# Patient Record
Sex: Female | Born: 1960 | Race: Black or African American | Hispanic: No | Marital: Married | Smoking: Never smoker
Health system: Southern US, Community
[De-identification: ages and names within clinical notes are randomized; demographics above are authoritative.]

## PROBLEM LIST (undated history)

## (undated) DIAGNOSIS — I10 Essential (primary) hypertension: Secondary | ICD-10-CM

## (undated) HISTORY — PX: MYOMECTOMY: SHX85

---

## 2007-12-01 ENCOUNTER — Emergency Department: Payer: Self-pay | Admitting: Emergency Medicine

## 2007-12-01 ENCOUNTER — Other Ambulatory Visit: Payer: Self-pay

## 2010-03-20 ENCOUNTER — Emergency Department: Payer: Self-pay | Admitting: Internal Medicine

## 2014-08-11 ENCOUNTER — Ambulatory Visit (INDEPENDENT_AMBULATORY_CARE_PROVIDER_SITE_OTHER): Payer: Managed Care, Other (non HMO)

## 2014-08-11 ENCOUNTER — Encounter: Payer: Self-pay | Admitting: Podiatry

## 2014-08-11 ENCOUNTER — Ambulatory Visit (INDEPENDENT_AMBULATORY_CARE_PROVIDER_SITE_OTHER): Payer: Managed Care, Other (non HMO) | Admitting: Podiatry

## 2014-08-11 VITALS — BP 115/80 | HR 70 | Resp 16 | Ht 67.0 in | Wt 160.0 lb

## 2014-08-11 DIAGNOSIS — M79672 Pain in left foot: Secondary | ICD-10-CM

## 2014-08-11 DIAGNOSIS — M21612 Bunion of left foot: Secondary | ICD-10-CM

## 2014-08-11 DIAGNOSIS — M2012 Hallux valgus (acquired), left foot: Secondary | ICD-10-CM

## 2014-08-11 NOTE — Progress Notes (Signed)
   Subjective:    Patient ID: Kelli Hernandez, female    DOB: 02/23/1961, 53 y.o.   MRN: [id number]  HPI Comments: Ms. Kelli Hernandez, 53 year old female, presents the office today with complaints of left foot bunion. She states that she has had a bunion over the area for several years however it has recently become more painful. She states the pain is mostly with walking and with shoes. She states that walking for periods of time will make her big toe ache. She has tried shoe gear changes, offloading, padding without any resolution of symptoms. Denies any recent injury or trauma to the area. No other complaints at this time.  Foot Pain      Review of Systems  Musculoskeletal:       Joint pain   All other systems reviewed and are negative.      Objective:   Physical Exam Objective: AAO x3, NAD DP/PT pulses palpable bilaterally, CRT less than 3 seconds Protective sensation intact with Simms Weinstein monofilament, vibratory sensation intact, Achilles tendon reflex intact Left foot structural HAV deformity with no pain or crepitus with range of motion of the first MTPJ. No hypermobility noted. Lateral deviation of the hallux with a prominent first metatarsal head medially. Mild hammertoe contractures of lesser digits, asymptomatic. MMT 5/5, ROM WNL No open lesions No calf pain with compression, swelling, warmth, erythema.       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 -X-rays were obtained and reviewed with the patient. -Conservative versus surgical options discussed including alternatives, risks, competitions. -At this time the patient has exhausted conservative treatment and she is requesting surgical intervention to help decrease her pain and deformity. I discussed with her various surgical options for correction at this time the patient elects to [facility]. I discussed with her risks of the surgery which include, but not  limited to, infection, bleeding, swelling, need for further surgery, delayed or nonhealing, painful or ugly scar, numbness or sensation changes, blood clots, hardware failure, reoccurrence, transfer lesions, over or under correction. I also discussed with her the postoperative course. No promises or guarantees were given as the outcome of the procedure. The surgical consent was reviewed with the patient. All questions were answered to the best of my ability. [facility]. -Follow-up after surgery. In the meantime the patient was encouraged to call the office with any questions, concerns or if there are any changes symptoms.

## 2014-08-11 NOTE — Patient Instructions (Signed)
Pre-Operative Instructions  Congratulations, you have decided to take an important step to improving your quality of life.  You can be assured that the doctors of [facility] will be with you every step of the way.  1. Plan to be at the surgery center/hospital at least 1 (one) hour prior to your scheduled time unless otherwise directed by the surgical center/hospital staff.  You must have a responsible adult accompany you, remain during the surgery and drive you home.  Make sure you have directions to the surgical center/hospital and know how to get there on time. 2. For hospital based surgery you will need to obtain a history and physical form from your family physician within 1 month prior to the date of surgery- we will give you a form for you primary physician.  3. We make every effort to accommodate the date you request for surgery.  There are however, times where surgery dates or times have to be moved.  We will contact you as soon as possible if a change in schedule is required.   4. No Aspirin/Ibuprofen for one week before surgery.  If you are on aspirin, any non-steroidal anti-inflammatory medications (Mobic, Aleve, Ibuprofen) you should stop taking it 7 days prior to your surgery.  You make take Tylenol  For pain prior to surgery.  5. Medications- If you are taking daily heart and blood pressure medications, seizure, reflux, allergy, asthma, anxiety, pain or diabetes medications, make sure the surgery center/hospital is aware before the day of surgery so they may notify you which medications to take or avoid the day of surgery. 6. No food or drink after midnight the night before surgery unless directed otherwise by surgical center/hospital staff. 7. No alcoholic beverages 24 hours prior to surgery.  No smoking 24 hours prior to or 24 hours after surgery. 8. Wear loose pants or shorts- loose enough to fit over bandages, boots, and casts. 9. No slip on shoes, sneakers are best. 10. Bring  your boot with you to the surgery center/hospital.  Also bring crutches or a walker if your physician has prescribed it for you.  If you do not have this equipment, it will be provided for you after surgery. 11. If you have not been contracted by the surgery center/hospital by the day before your surgery, call to confirm the date and time of your surgery. 12. Leave-time from work may vary depending on the type of surgery you have.  Appropriate arrangements should be made prior to surgery with your employer. 13. Prescriptions will be provided immediately following surgery by your doctor.  Have these filled as soon as possible after surgery and take the medication as directed. 14. Remove nail polish on the operative foot. 15. Wash the night before surgery.  The night before surgery wash the foot and leg well with the antibacterial soap provided and water paying special attention to beneath the toenails and in between the toes.  Rinse thoroughly with water and dry well with a towel.  Perform this wash unless told not to do so by your physician.  Enclosed: 1 Ice pack (please put in freezer the night before surgery)   1 Hibiclens skin cleaner   Pre-op Instructions  If you have any questions regarding the instructions, do not hesitate to call our office.  [place]: [address] [place], [place] [address] [phone]  [place]: [address] [place], [place] [address] [phone]  [place]: [address]  [place], [place] [address] [phone]  Dr. Richard   Tuchman DPM, Dr. Norman Regal DPM Dr. Richard Sikora DPM, Dr. M. Todd Hyatt DPM, Dr. Kathryn Egerton DPM 

## 2014-08-26 ENCOUNTER — Telehealth: Payer: Self-pay | Admitting: *Deleted

## 2014-08-26 NOTE — Telephone Encounter (Signed)
Pt called and was wanting a note for work because she is having surgery. i told pt she would more than likely get paperwork from her employer and will bring us the paperwork to fill out for her job on how long she will be out. Pt understood.

## 2014-09-10 DIAGNOSIS — M79673 Pain in unspecified foot: Secondary | ICD-10-CM

## 2014-09-14 ENCOUNTER — Encounter: Payer: Self-pay | Admitting: Podiatry

## 2014-09-14 DIAGNOSIS — M2012 Hallux valgus (acquired), left foot: Secondary | ICD-10-CM

## 2014-09-16 NOTE — Progress Notes (Signed)
[place] bunionectomy left with pin  Dos 12.7.15

## 2014-09-17 ENCOUNTER — Telehealth: Payer: Self-pay | Admitting: Podiatry

## 2014-09-17 NOTE — Telephone Encounter (Signed)
Attempted to call patient to follow-up with her after surgery, no answer.

## 2014-09-18 ENCOUNTER — Telehealth: Payer: Self-pay | Admitting: Podiatry

## 2014-09-18 NOTE — Telephone Encounter (Signed)
Called patient to follow-up with him after surgery. She states she is doing well and not requiring the narcotic pain medication and she is only taking ibuprofen. She has been continuing with the CAM boot. She denies any fevers, chills, nausea, vomiting. No other complaints. Follow-up as scheduled or sooner if any problems are to arise. Encouraged to call with any questions, concerns, change in symptoms.

## 2014-09-22 ENCOUNTER — Ambulatory Visit (INDEPENDENT_AMBULATORY_CARE_PROVIDER_SITE_OTHER): Payer: Managed Care, Other (non HMO)

## 2014-09-22 ENCOUNTER — Ambulatory Visit (INDEPENDENT_AMBULATORY_CARE_PROVIDER_SITE_OTHER): Payer: Managed Care, Other (non HMO) | Admitting: Podiatry

## 2014-09-22 VITALS — BP 119/76 | HR 66 | Resp 16

## 2014-09-22 DIAGNOSIS — Z9889 Other specified postprocedural states: Secondary | ICD-10-CM

## 2014-09-22 DIAGNOSIS — M21612 Bunion of left foot: Secondary | ICD-10-CM

## 2014-09-22 DIAGNOSIS — M2012 Hallux valgus (acquired), left foot: Secondary | ICD-10-CM

## 2014-09-22 NOTE — Progress Notes (Signed)
Patient ID: Kelli AstersBernice Hernandez, female   DOB: 06/21/1961, 53 y.o.   MRN: [id number]  Subjective: [age] year old female returns the office today postop visit #1 status post [place] bunionectomy left foot. Date of surgery 09/14/2014. The patient states that she is not taking any pain medication and only taking ibuprofen. She has completed her course of Keflex. She has not been taking the Phenergan as it is not needed. She's been continuing with the CAM boot. She does states that she had some increase in discomfort last night however it has decreased. She denies any systemic complaints such as fevers, chills, nausea, vomiting. No complaints such as shortness of breath, chest pain or calf pain. No other complaints at this time in no acute changes since last appointment.  Objective: AAO x3, NAD DP/PT pulses palpable bilaterally, CRT less than 3 seconds Protective sensation intact with Simms Weinstein monofilament, vibratory sensation intact, Achilles tendon reflex intact Incisional a dorsal medial aspect of the left foot as well coapted without any evidence of dehiscence. There is no surrounding erythema or ascending cellulitis. No drainage or other clinical signs of infection are noted. There is edema overlying the surgical site. There is mild discomfort upon palpation of the surgical site.  No pain with calf compression, swelling, warmth, erythema.  No open lesions or pre-ulcerative lesions.   Assessment: [age] year old female 8 days s/p Left Austin bunionectomy  Plan: -Treatment options were discussed including all alternatives, risks, complications -X-Ray were obtained and reviewed with the patient. The screw is slightly long which was discussed with the patient.  -Antibiotic ointment was applied over the incision followed by a DSD.  -Recommended to continue ice/elevation -Continue with CAM boot.  -Follow-up in 1 week or sooner should any problems arise. In the meantime, encouraged to call the office with any  questions, concerns, or change in symptoms.

## 2014-09-22 NOTE — Patient Instructions (Signed)
Keep bandages clean, dry, intact.  Monitor for any signs/symptoms of infection. Call the office immediately if any occur or go directly to the emergency room. Call with any questions/concerns.

## 2014-09-29 ENCOUNTER — Ambulatory Visit (INDEPENDENT_AMBULATORY_CARE_PROVIDER_SITE_OTHER): Payer: Managed Care, Other (non HMO) | Admitting: Podiatry

## 2014-09-29 ENCOUNTER — Encounter: Payer: Self-pay | Admitting: Podiatry

## 2014-09-29 VITALS — BP 128/76 | HR 65 | Resp 18

## 2014-09-29 DIAGNOSIS — Z9889 Other specified postprocedural states: Secondary | ICD-10-CM

## 2014-09-29 DIAGNOSIS — M21612 Bunion of left foot: Secondary | ICD-10-CM

## 2014-09-29 DIAGNOSIS — M2012 Hallux valgus (acquired), left foot: Secondary | ICD-10-CM

## 2014-10-05 NOTE — Progress Notes (Signed)
Patient ID: Ronney AstersBernice Tabb, female   DOB: 11/03/1960, 53 y.o.   MRN: [id number]  Subjective: 53 year old female presents the office today for postop visit #2 status post left foot bunionectomy date of surgery 09/14/2014. The patient states that she is doing well and her pain is controlled. She's been continuing the CAM boot. Denies any systemic complaints such as fevers, chills, nausea, vomiting. No chest pain, stress of breath or calf pain. No other complaints at this time in no acute changes since last appointment.  Objective: AAO 3, NAD DP/PT pulses palpable, CRT less than 3 seconds Protective sensation intact with Simms Weinstein monofilament Incision along the dorsal medial aspect of left foot well coapted without any evidence of dehiscence. There is no drainage expressed or any surrounding erythema or ascending cellulitis. There is edema overlying the surgical site although slightly decreased compared to last appointment. There is no clinical signs of infection. Mild discomfort dressing over the surgical site. There is no pain with range of motion of the first MTPJ. No other areas of pinpoint tenderness No pain with calf compression, swelling, warmth, erythema  Assessment: 53 year old female postop visit #2 status post left foot [place] bunionectomy date of surgery 09/14/14  Plan: -Treatment options discussed with the patient including alternatives, risks, complications. -Suture ends were cut, Steri-Strips were applied. -Discussed the patient she can start gradual gentle range of motion activities of the first MTPJ which were discussed with her. -Can start to shower however do not since the foot. Apply an appointment over the incision. Discussed the patient that there is any opening incision not to shower or get the area wet and call the office.  -Continue CAM boot. -Ice/elevation -Dispensed anklet to help edema.  -Follow-up in 2 weeks for repeat x-rays or sooner should any problems arise.  In the meantime, call the office with any questions, concerns, changes symptoms.

## 2014-10-13 ENCOUNTER — Ambulatory Visit (INDEPENDENT_AMBULATORY_CARE_PROVIDER_SITE_OTHER): Payer: Managed Care, Other (non HMO) | Admitting: Podiatry

## 2014-10-13 ENCOUNTER — Ambulatory Visit (INDEPENDENT_AMBULATORY_CARE_PROVIDER_SITE_OTHER): Payer: Managed Care, Other (non HMO)

## 2014-10-13 VITALS — BP 118/70 | HR 68 | Resp 16

## 2014-10-13 DIAGNOSIS — M21612 Bunion of left foot: Secondary | ICD-10-CM

## 2014-10-13 DIAGNOSIS — M2012 Hallux valgus (acquired), left foot: Secondary | ICD-10-CM

## 2014-10-13 DIAGNOSIS — Z9889 Other specified postprocedural states: Secondary | ICD-10-CM

## 2014-10-13 NOTE — Progress Notes (Signed)
Patient ID: Kelli AstersBernice Hernandez, female   DOB: 07/29/1961, 54 y.o.   MRN: [id number]  Subjective: 54 year old female returns the office for postop visit #3 status post left foot [name] bunionectomy, date of surgery 09/14/2014. She states that she's been continued with a CAM boot. She states that her pain is controlled currently and is doing well. She denies any systemic complaints such as fevers, chills, nausea, vomiting. Denies any chest pain, shortness of breath, calf pain. No acute changes since last appointment and no other complaints at this time.  Objective: AAO 3, NAD DP/PT pulses palpable, CRT less than 3 seconds Protective sensation intact with Simms Weinstein monofilament, Achilles tendon reflex intact. Incision along the dorsal medial aspect of the left foot as well coapted without any evidence of dehiscence. There is a small amount of flaky skin around the incision. There is no surrounding erythema, ascending cellulitis, or any other clinical signs of infection. First MTPJ range of motion is intact and without pain. There is mild edema overlying the surgical site. There is no significant tenderness to palpation over the surgical site.  No other areas of open lesions or pre-ulcerative lesions. No pain with calf compression, swelling, warmth, erythema.  Assessment: 54 year old female status post left foot [place] bunionectomy with screw fixation, date of surgery 09/14/2014  Plan: -X-rays were obtained and reviewed with the patient. The osteotomy site is still visible somewhat although is improved. -Treatment options were discussed with the patient including alternatives, risks, complications. -Discussed the patient to continue applying a small amount of antibiotic ointment over the incision. Discussed that she can wash the area however keep it clean. -Continue with CAM boot. -Continue with first MTPJ range of motion exercises. -Continue ice and elevation. -Recommended the patient is at a  work until follow-up evaluation. Follow-up in 2 weeks for repeat x-rays. At that time we'll likely start to transition out of the boot back into a regular loosefitting sneaker. After she is able to wear sneaker/shoe without problems she will be able to return to work. In the meantime, encouraged to call the office with any questions, concerns, change in symptoms.

## 2014-10-15 ENCOUNTER — Telehealth: Payer: Self-pay | Admitting: *Deleted

## 2014-10-15 NOTE — Telephone Encounter (Signed)
CALLED AND LEFT MESSAGE ASKING PT WHERE DISABILITY FORM NEEDS TO BE MAILED/FAXED TO?

## 2014-10-27 ENCOUNTER — Ambulatory Visit (INDEPENDENT_AMBULATORY_CARE_PROVIDER_SITE_OTHER): Payer: Managed Care, Other (non HMO) | Admitting: Podiatry

## 2014-10-27 ENCOUNTER — Ambulatory Visit (INDEPENDENT_AMBULATORY_CARE_PROVIDER_SITE_OTHER): Payer: Managed Care, Other (non HMO)

## 2014-10-27 VITALS — BP 133/80 | HR 76 | Resp 16

## 2014-10-27 DIAGNOSIS — M21612 Bunion of left foot: Secondary | ICD-10-CM

## 2014-10-27 DIAGNOSIS — Z9889 Other specified postprocedural states: Secondary | ICD-10-CM

## 2014-10-27 DIAGNOSIS — M2012 Hallux valgus (acquired), left foot: Secondary | ICD-10-CM | POA: Diagnosis not present

## 2014-10-27 NOTE — Progress Notes (Signed)
Patient ID: Kelli Hernandez, female   DOB: 04/08/1961, 54 y.o.   MRN: [id number]  Subjective: 54 year old female presents the office for follow-up evaluation status post left foot [place] Hernandez performed on 09/14/14. She states that she's been doing well she has not required any pain medication. Last week she did start to transition into a slip on shoe. She states that since started to wear the shoe she is not had any increase in discomfort. She doesn't other some mild swelling over the area however it has decreased. Denies any systemic complaints as fevers, chills, nausea, vomiting. No other complaints at this time in no acute changes since last appointment.  AAO x3, NAD DP/PT pulses palpable bilaterally, CRT less than 3 seconds Protective sensation intact with Simms Weinstein monofilament, vibratory sensation intact, Achilles tendon reflex intact Incision along the dorsal medial aspect of the left foot as well coapted without any evidence of dehiscence. There is no swelling erythema, drainage, or any other clinical signs of infection at this time. There is mild edema over the surgical site, although decreased. There is no tenderness along the incision or the surgical site. No pain with 1st MTPJ ROM, and the ROM is intact. No other areas of tenderness to bilateral lower extremities. No overlying erythema or increased warmth.  No pain with calf compression, swelling, warmth, erythema.  No open lesions or pre-ulcerative lesions.   Assessment: S/p Left Kelli Hernandez, doing well   Plan: -Treatment options discussed including all alternatives, risks, complications discussed with the patient.  -She continue transition into a regular shoe as tolerated. If there is any increasing discomfort while transitioning to return to the surgical shoe as needed and call the office. -She can return to work on Monday as tolerated with no restrictions other than wearing a supportive shoe. -Continues with anklet  to help swelling as needed. -Continue first MTPJ range of motion activities. -Follow-up in 4 weeks or sooner should any problems arise. In the meantime, Kelli Hernandez call the office with any questions, concerns, changes symptoms.

## 2014-11-24 ENCOUNTER — Ambulatory Visit: Payer: Managed Care, Other (non HMO) | Admitting: Podiatry

## 2015-03-11 ENCOUNTER — Ambulatory Visit: Payer: Managed Care, Other (non HMO) | Admitting: Podiatry

## 2015-07-27 ENCOUNTER — Ambulatory Visit: Payer: 59 | Admitting: Podiatry

## 2015-07-29 ENCOUNTER — Ambulatory Visit (INDEPENDENT_AMBULATORY_CARE_PROVIDER_SITE_OTHER): Payer: 59

## 2015-07-29 ENCOUNTER — Ambulatory Visit (INDEPENDENT_AMBULATORY_CARE_PROVIDER_SITE_OTHER): Payer: 59 | Admitting: Podiatry

## 2015-07-29 DIAGNOSIS — M2012 Hallux valgus (acquired), left foot: Secondary | ICD-10-CM | POA: Diagnosis not present

## 2015-07-29 DIAGNOSIS — L910 Hypertrophic scar: Secondary | ICD-10-CM

## 2015-07-29 DIAGNOSIS — M898X9 Other specified disorders of bone, unspecified site: Secondary | ICD-10-CM

## 2015-07-30 NOTE — Progress Notes (Signed)
Patient ID: Ronney AstersBernice Coca, female   DOB: 02/11/1961, 54 y.o.   MRN: [id number]  Subjective: 54 year old female presents to the office today for concerns of continued pain on the bunion site as well as a thick scar has formed overlying the area. She states that she is able to wear a regular shoe without much difficulty although she does get some occasional pain, not every day, overlying the bunion surgical site. She also states that since her last time with me she has about the thick scar over the surgical site. She said no previous treatment freed of these issues since I last saw her. No recent injury or trauma. No other complaints at this time.  Objective: AAO 3, NAD DP/PT pulses 2/4, CRT less than 3 seconds Protective sensation appears to be intact with Dorann OuSimms Weinstein monofilament Incision is well coapted and the dorsal medial aspect of the left foot however there does appear to be hypertrophic, keloid-like scar present. There is no tenderness to palpation upon the area directed to the scar. There is mild tenderness to palpation along the medial aspect of first metatarsal head as well as dorsally. Subjective she states it is not painful today although this is where she gets pain. Range of motion is intact the first MTPJ. There is no other areas of tenderness of bilateral lower shoes. There is no significant edema overlying the surgical site there is no assisted erythema or increase in warmth. There is no pain with calf compression, swelling, warmth, erythema.  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  -Treatment options discussed including all alternatives, risks, and complications -Etiology of symptoms were discussed -In regards to the pain on the top and the side of the foot I discussed x-ray findings. Compared to the prior x-rays there does appear to be some ever  trophic bone growth as well as an exostosis off the dorsal first metatarsal head. I discussed with her padding offloading. I also discussed the possible future surgery to smooth the area needed. Currently the pain is not consistent and she'll consider her options. -In regards to the hypertrophic/keloid scar area was injected today with triamcinolone and 0.5% Marcaine plain. Injection was completed without complications. Continue with vitamin E cream over on the area daily. -Follow-up in 2-3 weeks for repeat injection for the scar. In the meantime call the office with any questions, concerns, change in symptoms.  Ovid CurdMatthew Wagoner, DPM

## 2015-08-19 ENCOUNTER — Ambulatory Visit: Payer: 59 | Admitting: Podiatry

## 2015-09-07 ENCOUNTER — Ambulatory Visit (INDEPENDENT_AMBULATORY_CARE_PROVIDER_SITE_OTHER): Payer: 59 | Admitting: Podiatry

## 2015-09-07 ENCOUNTER — Encounter: Payer: Self-pay | Admitting: Podiatry

## 2015-09-07 VITALS — BP 117/78 | HR 77 | Resp 18

## 2015-09-07 DIAGNOSIS — L910 Hypertrophic scar: Secondary | ICD-10-CM | POA: Diagnosis not present

## 2015-09-07 DIAGNOSIS — M2012 Hallux valgus (acquired), left foot: Secondary | ICD-10-CM | POA: Diagnosis not present

## 2015-09-07 NOTE — Progress Notes (Signed)
Patient ID: Kelli Hernandez, female   DOB: 03/16/1961, 54 y.o.   MRN: [id number]  Subjective: 54 year old female presents to the office today for  Follow-up evaluation of keloid scarring over the bunion site and for pain status post bunion surgery. He states that since last appointment the injection helped quite a bit and she is not having any pain. She would proceed with treatment to help with the scarring.  No recent injury or trauma. No other complaints at this time.  Objective: AAO 3, NAD DP/PT pulses 2/4, CRT less than 3 seconds Protective sensation appears to be intact with Dorann OuSimms Weinstein monofilament Incision is well coapted and the dorsal medial aspect of the left foot however there does appear to be hypertrophic, keloid-like scar present. There is no tenderness to palpation upon the area directed to the scar. There is no tenderness to palpation along the medial aspect of first metatarsal head as well as dorsally where she was having pain previously. Range of motion is intact the first MTPJ. There is no other areas of tenderness of bilateral lower shoes. There is no significant edema overlying the surgical site there is no assisted erythema or increase in warmth. There is no pain with calf compression, swelling, warmth, erythema.  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 Injection was completed without complications. Continue with vitamin E cream over on the area daily. -Scar compound cream was ordered for the patient. -Offloading pads -Follow-up in 2-3 weeks for repeat injection for the scar. In the meantime call the office with any questions, concerns, change in symptoms.  Ovid CurdMatthew  Tequila Rottmann, DPM

## 2015-09-08 ENCOUNTER — Telehealth: Payer: Self-pay | Admitting: *Deleted

## 2015-09-08 MED ORDER — NONFORMULARY OR COMPOUNDED ITEM: Status: AC

## 2015-09-08 NOTE — Telephone Encounter (Signed)
Dr. Ardelle AntonWagoner ordered Aspirar compound BDV, orders faxed.

## 2015-09-28 ENCOUNTER — Ambulatory Visit: Admitting: Podiatry

## 2015-10-03 ENCOUNTER — Emergency Department
Admission: EM | Admit: 2015-10-03 | Discharge: 2015-10-03 | Disposition: A | Discharge status: Home / Self Care | Payer: 59 | Attending: Emergency Medicine | Admitting: Emergency Medicine

## 2015-10-03 ENCOUNTER — Encounter: Payer: Self-pay | Admitting: Emergency Medicine

## 2015-10-03 ENCOUNTER — Emergency Department: Payer: 59

## 2015-10-03 DIAGNOSIS — Z792 Long term (current) use of antibiotics: Secondary | ICD-10-CM | POA: Insufficient documentation

## 2015-10-03 DIAGNOSIS — Z79899 Other long term (current) drug therapy: Secondary | ICD-10-CM | POA: Diagnosis not present

## 2015-10-03 DIAGNOSIS — H8110 Benign paroxysmal vertigo, unspecified ear: Secondary | ICD-10-CM

## 2015-10-03 DIAGNOSIS — I10 Essential (primary) hypertension: Secondary | ICD-10-CM | POA: Diagnosis not present

## 2015-10-03 DIAGNOSIS — R42 Dizziness and giddiness: Secondary | ICD-10-CM | POA: Diagnosis present

## 2015-10-03 HISTORY — DX: Essential (primary) hypertension: I10

## 2015-10-03 LAB — CBC
HEMATOCRIT: 36.2 % (ref 35.0–47.0)
HEMOGLOBIN: 12.4 g/dL (ref 12.0–16.0)
MCH: 30.4 pg (ref 26.0–34.0)
MCHC: 34.1 g/dL (ref 32.0–36.0)
MCV: 89 fL (ref 80.0–100.0)
Platelets: 186 10*3/uL (ref 150–440)
RBC: 4.07 MIL/uL (ref 3.80–5.20)
RDW: 13.9 % (ref 11.5–14.5)
WBC: 5.8 10*3/uL (ref 3.6–11.0)

## 2015-10-03 LAB — COMPREHENSIVE METABOLIC PANEL
ALK PHOS: 66 U/L (ref 38–126)
ALT: 15 U/L (ref 14–54)
ANION GAP: 7 (ref 5–15)
AST: 24 U/L (ref 15–41)
Albumin: 3.7 g/dL (ref 3.5–5.0)
BILIRUBIN TOTAL: 0.6 mg/dL (ref 0.3–1.2)
BUN: 18 mg/dL (ref 6–20)
CALCIUM: 9.1 mg/dL (ref 8.9–10.3)
CO2: 27 mmol/L (ref 22–32)
Chloride: 104 mmol/L (ref 101–111)
Creatinine, Ser: 0.72 mg/dL (ref 0.44–1.00)
Glucose, Bld: 130 mg/dL — ABNORMAL HIGH (ref 65–99)
Potassium: 3.4 mmol/L — ABNORMAL LOW (ref 3.5–5.1)
Sodium: 138 mmol/L (ref 135–145)
TOTAL PROTEIN: 6.9 g/dL (ref 6.5–8.1)

## 2015-10-03 LAB — TROPONIN I: Troponin I: <0.03 ng/mL (ref <0.031)

## 2015-10-03 MED ORDER — MECLIZINE HCL 25 MG PO TABS: 25.0000 mg | ORAL_TABLET | Freq: Three times a day (TID) | ORAL | Status: AC | PRN

## 2015-10-03 MED ORDER — DIAZEPAM 5 MG/ML IJ SOLN
5.0000 mg | Freq: Once | INTRAMUSCULAR | Status: AC
  Administered 2015-10-03: 5 mg via INTRAVENOUS
  Filled 2015-10-03: qty 2

## 2015-10-03 MED ORDER — DIAZEPAM 5 MG PO TABS: 5.0000 mg | ORAL_TABLET | Freq: Three times a day (TID) | ORAL | Status: AC | PRN

## 2015-10-03 MED ORDER — SODIUM CHLORIDE 0.9 % IV SOLN
1000.0000 mL | Freq: Once | INTRAVENOUS | Status: AC
  Administered 2015-10-03: 1000 mL via INTRAVENOUS

## 2015-10-03 NOTE — ED Provider Notes (Signed)
[facility] Emergency Department Provider Note  ____________________________________________  Time seen: On arrival  I have reviewed the triage vital signs and the nursing notes.   HISTORY  Chief Complaint Dizziness    HPI Kelli Hernandez is a 54 y.o. female who presents with approximately one week of intermittent vertigo. She reports movement of her head in a certain direction will trigger the sensation of the room spinning, nausea and feeling off balance. She has had this in the distant past before. She denies ear pain although she is just getting over a cold. No head injury. No focal neuro deficits. No headache. Negative medications. No ringing in her ear. He has had improvement with Antivert     Past Medical History  Diagnosis Date  . Hypertension     There are no active problems to display for this patient.   Past Surgical History  Procedure Laterality Date  . Myomectomy      Current Outpatient Rx  Name  Route  Sig  Dispense  Refill  . EXPIRED: amLODipine (NORVASC) 5 MG tablet   Oral   Take 5 mg by mouth.         Marland Kitchen amLODipine (NORVASC) 5 MG tablet      TAKE 1 TABLET (5 MG TOTAL) BY MOUTH DAILY.         Marland Kitchen Black Cohosh 40 MG CAPS   Oral   Take by mouth 2 (two) times daily.         . cephALEXin (KEFLEX) 500 MG capsule               . citalopram (CELEXA) 20 MG tablet   Oral   Take by mouth.         . hydrochlorothiazide (HYDRODIURIL) 25 MG tablet   Oral   Take by mouth.         . hydrochlorothiazide (HYDRODIURIL) 25 MG tablet      TAKE 1 TABLET (25 MG TOTAL) BY MOUTH DAILY.         Marland Kitchen ibuprofen (ADVIL,MOTRIN) 200 MG tablet   Oral   Take 200 mg by mouth as needed.         . NONFORMULARY OR COMPOUNDED ITEM      [facility] compound:  (BDV) Tendinosis - Strictures - Scarring cream containing Baclofen 2%, Diclofenac 3%, Verapamil 10% dispense 180 grams +2 refills to apply to affected area 1-2 grams 3-4 times  a day.   180 each   2   . Nutritional Supplements (ESTROVEN NIGHTTIME PO)   Oral   Take by mouth daily.           Allergies Morphine and Sulfa antibiotics  No family history on file.  Social History Social History  Substance Use Topics  . Smoking status: Never Smoker   . Smokeless tobacco: None  . Alcohol Use: No    Review of Systems  Constitutional: Negative for fever. Eyes: Negative for visual changes. ENT: Negative for sore throat Cardiovascular: Negative for chest pain. Respiratory: Negative for shortness of breath. Gastrointestinal: Negative for abdominal pain, Genitourinary: Negative for dysuria. Musculoskeletal: Negative for back pain. Skin: Negative for rash. Neurological: Negative for headaches or focal weakness Psychiatric: No anxiety    ____________________________________________   PHYSICAL EXAM:  VITAL SIGNS: ED Triage Vitals  Enc Vitals Group     BP 10/03/15 0941 122/79 mmHg     Pulse Rate 10/03/15 0941 68     Resp 10/03/15 0941 18     Temp  10/03/15 0941 98.1 F (36.7 C)     Temp Source 10/03/15 0941 Oral     SpO2 10/03/15 0941 96 %     Weight 10/03/15 0941 160 lb (72.576 kg)     Height 10/03/15 0941 5' 7.5" (1.715 m)     Head Cir --      Peak Flow --      Pain Score 10/03/15 0942 2     Pain Loc --      Pain Edu? --      Excl. in GC? --      Constitutional: Alert and oriented. Well appearing and in no distress. Eyes: Conjunctivae are normal.  ENT   Head: Normocephalic and atraumatic.   Mouth/Throat: Mucous membranes are moist. Ears: Fluid noted behind bilateral TMs, no erythema Cardiovascular: Normal rate, regular rhythm. Normal and symmetric distal pulses are present in all extremities. No murmurs, rubs, or gallops. Respiratory: Normal respiratory effort without tachypnea nor retractions. Breath sounds are clear and equal bilaterally.  Gastrointestinal: Soft and non-tender in all quadrants. No distention. There is no CVA  tenderness. Genitourinary: deferred Musculoskeletal: Nontender with normal range of motion in all extremities. No lower extremity tenderness nor edema. Neurologic:  Normal speech and language. No gross focal neurologic deficits are appreciated. Cranial nerves II through XII are normal, strength normal in all extremities Skin:  Skin is warm, dry and intact. No rash noted. Psychiatric: Mood and affect are normal. Patient exhibits appropriate insight and judgment.  ____________________________________________    LABS (pertinent positives/negatives)  Labs Reviewed  COMPREHENSIVE METABOLIC PANEL - Abnormal; Notable for the following:    Potassium 3.4 (*)    Glucose, Bld 130 (*)    All other components within normal limits  CBC  TROPONIN I    ____________________________________________   EKG  ED ECG REPORT I, Jene EveryKINNER, Kamalei Roeder, the attending physician, personally viewed and interpreted this ECG.  Date: 10/03/2015 EKG Time: 10:07 AM Rate: 64 Rhythm: normal sinus rhythm QRS Axis: normal Intervals: normal ST/T Wave abnormalities: normal Conduction Disutrbances: none Narrative Interpretation: unremarkable   ____________________________________________    RADIOLOGY I have personally reviewed any xrays that were ordered on this patient: CT head unremarkable  ____________________________________________   PROCEDURES  Procedure(s) performed: none  Critical Care performed: none  ____________________________________________   INITIAL IMPRESSION / ASSESSMENT AND PLAN / ED COURSE  Pertinent labs & imaging results that were available during my care of the patient were reviewed by me and considered in my medical decision making (see chart for details).  Patient well-appearing and in no distress. History of present illness is most consistent with BPV. CT head is unremarkable. Exam including detailed neuro exam is unremarkable. Patient had improvement with Valium and IV  fluids. We will discharge with ENT follow-up with Valium and meclizine. Strict return precautions discussed  ____________________________________________   FINAL CLINICAL IMPRESSION(S) / ED DIAGNOSES  Final diagnoses:  BPV (benign positional vertigo), unspecified laterality     Jene Everyobert Jemell Town, MD 10/03/15 1413

## 2015-10-03 NOTE — ED Notes (Addendum)
Patient to ER for "vertigo" x 1 week. +Nausea and headache. Patient has h/o vertigo episode x2 approx 5 years ago, but states this episode has been intermittent/lasted longer than last episodes.

## 2015-10-03 NOTE — Discharge Instructions (Signed)
Benign Positional Vertigo Vertigo is the feeling that you or your surroundings are moving when they are not. Benign positional vertigo is the most common form of vertigo. The cause of this condition is not serious (is benign). This condition is triggered by certain movements and positions (is positional). This condition can be dangerous if it occurs while you are doing something that could endanger you or others, such as driving.  CAUSES In many cases, the cause of this condition is not known. It may be caused by a disturbance in an area of the inner ear that helps your brain to sense movement and balance. This disturbance can be caused by a viral infection (labyrinthitis), head injury, or repetitive motion. RISK FACTORS This condition is more likely to develop in:  Women.  People who are 50 years of age or older. SYMPTOMS Symptoms of this condition usually happen when you move your head or your eyes in different directions. Symptoms may start suddenly, and they usually last for less than a minute. Symptoms may include:  Loss of balance and falling.  Feeling like you are spinning or moving.  Feeling like your surroundings are spinning or moving.  Nausea and vomiting.  Blurred vision.  Dizziness.  Involuntary eye movement (nystagmus). Symptoms can be mild and cause only slight annoyance, or they can be severe and interfere with daily life. Episodes of benign positional vertigo may return (recur) over time, and they may be triggered by certain movements. Symptoms may improve over time. DIAGNOSIS This condition is usually diagnosed by medical history and a physical exam of the head, neck, and ears. You may be referred to a health care provider who specializes in ear, nose, and throat (ENT) problems (otolaryngologist) or a provider who specializes in disorders of the nervous system (neurologist). You may have additional testing, including:  MRI.  A CT scan.  Eye movement tests. Your  health care provider may ask you to change positions quickly while he or she watches you for symptoms of benign positional vertigo, such as nystagmus. Eye movement may be tested with an electronystagmogram (ENG), caloric stimulation, the Dix-Hallpike test, or the roll test.  An electroencephalogram (EEG). This records electrical activity in your brain.  Hearing tests. TREATMENT Usually, your health care provider will treat this by moving your head in specific positions to adjust your inner ear back to normal. Surgery may be needed in severe cases, but this is rare. In some cases, benign positional vertigo may resolve on its own in 2-4 weeks. HOME CARE INSTRUCTIONS Safety  Move slowly.Avoid sudden body or head movements.  Avoid driving.  Avoid operating heavy machinery.  Avoid doing any tasks that would be dangerous to you or others if a vertigo episode would occur.  If you have trouble walking or keeping your balance, try using a cane for stability. If you feel dizzy or unstable, sit down right away.  Return to your normal activities as told by your health care provider. Ask your health care provider what activities are safe for you. General Instructions  Take over-the-counter and prescription medicines only as told by your health care provider.  Avoid certain positions or movements as told by your health care provider.  Drink enough fluid to keep your urine clear or pale yellow.  Keep all follow-up visits as told by your health care provider. This is important. SEEK MEDICAL CARE IF:  You have a fever.  Your condition gets worse or you develop new symptoms.  Your family or friends   notice any behavioral changes.  Your nausea or vomiting gets worse.  You have numbness or a "pins and needles" sensation. SEEK IMMEDIATE MEDICAL CARE IF:  You have difficulty speaking or moving.  You are always dizzy.  You faint.  You develop severe headaches.  You have weakness in your  legs or arms.  You have changes in your hearing or vision.  You develop a stiff neck.  You develop sensitivity to light.   This information is not intended to replace advice given to you by your health care provider. Make sure you discuss any questions you have with your health care provider.   Document Released: 07/03/2006 Document Revised: 06/16/2015 Document Reviewed: 01/18/2015 Elsevier Interactive Patient Education 2016 [employer]  

## 2015-10-03 NOTE — ED Notes (Addendum)
Pt reports having a history of vertigo off and on for several years.  For the past 8-9 days patient has been having vertigo off and on and this morning she woke up and started having vertigo and decided to come to be evaluated. Pt has hx of HTN and did not take her BP medications yet this morning. C/o vertigo with movement of her head in certain directions.

## 2015-10-12 ENCOUNTER — Ambulatory Visit (INDEPENDENT_AMBULATORY_CARE_PROVIDER_SITE_OTHER): Admitting: Podiatry

## 2015-10-12 ENCOUNTER — Encounter: Payer: Self-pay | Admitting: Podiatry

## 2015-10-12 VITALS — BP 127/80 | HR 61 | Resp 18

## 2015-10-12 DIAGNOSIS — L910 Hypertrophic scar: Secondary | ICD-10-CM | POA: Diagnosis not present

## 2015-10-12 NOTE — Progress Notes (Signed)
Patient ID: Kelli Hernandez, female   DOB: 05/22/1961, 55 y.o.   MRN: [id number]  Subjective: 55 year old female presents to the office today for follow-up evaluation of keloid scarring over the bunion site and for pain status post bunion surgery. She states that since starting the steroid injection she has noticed that the incision/scar has flattened although it does remain discolored. She was a compound cream due to cost. No other complaints at this time.  Objective: AAO 3, NAD DP/PT pulses 2/4, CRT less than 3 seconds Protective sensation appears to be intact with Dorann OuSimms Weinstein monofilament Incision is well coapted and the dorsal medial aspect of the left foot however there does appear to be hypertrophic, keloid-like scar present. The scar does appear to be more flat compared to previous appointment although it does remain hyperpigmented. There is no pain to the scar. No pain along the surgical site or with MPJ range of motion. No other areas of tenderness bilateral lower extremities. There is no significant edema overlying the surgical site there is no assisted erythema or increase in warmth. There is no pain with calf compression, swelling, warmth, erythema.  Assessment: 55 year old female with hypertrophic/keloid scar with improving symptoms.   Plan: -Treatment options discussed including all alternatives, risks, and complications -In regards to the hypertrophic/keloid scar area was injected today with triamcinolone and 0.5% andMarcaine plain. Injection was completed without complications. Continue with vitamin E cream over on the area daily. -Scar compound cream was ordered for the patient. Re-ordered through a different pharmacy.  -Follow-up in 3 weeks for repeat injection for the scar. In the meantime call the office with any questions, concerns, change in symptoms.  Ovid CurdMatthew Wagoner, DPM

## 2015-11-05 ENCOUNTER — Other Ambulatory Visit: Payer: Self-pay | Admitting: Otolaryngology

## 2015-11-05 DIAGNOSIS — R42 Dizziness and giddiness: Secondary | ICD-10-CM

## 2015-11-10 ENCOUNTER — Ambulatory Visit
Admission: RE | Admit: 2015-11-10 | Discharge: 2015-11-10 | Disposition: A | Discharge status: Home / Self Care | Payer: 59 | Source: Ambulatory Visit | Attending: Otolaryngology | Admitting: Otolaryngology

## 2015-11-10 DIAGNOSIS — R42 Dizziness and giddiness: Secondary | ICD-10-CM

## 2015-11-10 MED ORDER — GADOBENATE DIMEGLUMINE 529 MG/ML IV SOLN
15.0000 mL | Freq: Once | INTRAVENOUS | Status: AC | PRN
  Administered 2015-11-10: 15 mL via INTRAVENOUS

## 2015-11-11 ENCOUNTER — Ambulatory Visit: Admitting: Podiatry

## 2015-12-09 ENCOUNTER — Ambulatory Visit: Admitting: Podiatry

## 2017-05-17 ENCOUNTER — Emergency Department: Payer: 59

## 2017-05-17 ENCOUNTER — Emergency Department
Admission: EM | Admit: 2017-05-17 | Discharge: 2017-05-17 | Disposition: A | Discharge status: Home / Self Care | Payer: 59 | Attending: Emergency Medicine | Admitting: Emergency Medicine

## 2017-05-17 ENCOUNTER — Encounter: Payer: Self-pay | Admitting: Emergency Medicine

## 2017-05-17 DIAGNOSIS — Z7982 Long term (current) use of aspirin: Secondary | ICD-10-CM | POA: Insufficient documentation

## 2017-05-17 DIAGNOSIS — Z79899 Other long term (current) drug therapy: Secondary | ICD-10-CM | POA: Diagnosis not present

## 2017-05-17 DIAGNOSIS — R079 Chest pain, unspecified: Secondary | ICD-10-CM

## 2017-05-17 DIAGNOSIS — I10 Essential (primary) hypertension: Secondary | ICD-10-CM | POA: Diagnosis not present

## 2017-05-17 LAB — TROPONIN I

## 2017-05-17 LAB — BASIC METABOLIC PANEL
Anion gap: 5 (ref 5–15)
BUN: 14 mg/dL (ref 6–20)
CALCIUM: 9.4 mg/dL (ref 8.9–10.3)
CO2: 30 mmol/L (ref 22–32)
CREATININE: 0.98 mg/dL (ref 0.44–1.00)
Chloride: 102 mmol/L (ref 101–111)
Glucose, Bld: 81 mg/dL (ref 65–99)
Potassium: 3.7 mmol/L (ref 3.5–5.1)
SODIUM: 137 mmol/L (ref 135–145)

## 2017-05-17 LAB — CBC
HCT: 36.8 % (ref 35.0–47.0)
Hemoglobin: 12.6 g/dL (ref 12.0–16.0)
MCH: 30.4 pg (ref 26.0–34.0)
MCHC: 34.3 g/dL (ref 32.0–36.0)
MCV: 88.7 fL (ref 80.0–100.0)
PLATELETS: 210 10*3/uL (ref 150–440)
RBC: 4.15 MIL/uL (ref 3.80–5.20)
RDW: 14 % (ref 11.5–14.5)
WBC: 7 10*3/uL (ref 3.6–11.0)

## 2017-05-17 MED ORDER — ASPIRIN 81 MG PO CHEW
243.0000 mg | CHEWABLE_TABLET | Freq: Once | ORAL | Status: AC
Start: 2017-05-17 — End: 2017-05-17
  Administered 2017-05-17: 243 mg via ORAL
  Filled 2017-05-17: qty 3

## 2017-05-17 MED ORDER — ASPIRIN EC 81 MG PO TBEC: 243.0000 mg | DELAYED_RELEASE_TABLET | Freq: Once | ORAL | Status: DC

## 2017-05-17 NOTE — ED Triage Notes (Signed)
Pt with left sided chest pain radiating into left arm since yesterday

## 2017-05-17 NOTE — Discharge Instructions (Signed)

## 2017-05-17 NOTE — ED Notes (Signed)
Medication administered per MD order. Pt visualized in NAD, explained waiting on results on 2nd troponin. Pt and husband state understanding. Will continue to monitor for further patient needs.

## 2017-05-17 NOTE — ED Provider Notes (Signed)
[facility] Emergency Department Provider Note  ____________________________________________   First MD Initiated Contact with Patient 05/17/17 1642     (approximate)  I have reviewed the triage vital signs and the nursing notes.   HISTORY  Chief Complaint Chest Pain    HPI Kelli Hernandez is a 56 y.o. female who presents for evaluation of chest pain that occurred both last night and again today. She reports that she has had several episodes of chest pain over the last few months and it seems to only occur when she is under particularly severe stress either at work or at home. She has never had it evaluated for the reason that she associated it with stress, but when it happened today it was stronger than before. She describes it as being a left-sided and radiated somewhat down her arm. She is asymptomatic at this time. It was not accompanied with shortness of breath, nausea, vomiting, sweating, or abdominal pain. Nothing in particular makes it better and it did last for several hours both yesterday and today. even though she feels confident that it is associated only with stress she felt like she should have it evaluated "just to make sure everything was okay".  she has a primary care doctor but has not ever seen a cardiologist. She has hypertension. She does not have diabetes, hyperlipidemia, has never used tobacco, and has no first-degree relatives that I have ever suffered from ACS/MI. she has had no numbness nor tingling nor weakness in her arms nor legs although the pain did radiate somewhat to her left arm.   Past Medical History:  Diagnosis Date  . Hypertension     There are no active problems to display for this patient.   Past Surgical History:  Procedure Laterality Date  . MYOMECTOMY      Prior to Admission medications   Medication Sig Start Date End Date Taking? Authorizing Provider  Acetaminophen 325 MG CAPS Take 3 capsules by mouth daily.    Yes [provider]  amLODipine (NORVASC) 5 MG tablet Take 5 mg by mouth. 04/02/14 05/17/17 Yes [provider]  aspirin EC 81 MG tablet Take 81 mg by mouth daily.   Yes [provider]  citalopram (CELEXA) 20 MG tablet Take by mouth.   Yes [provider]  hydrochlorothiazide (HYDRODIURIL) 25 MG tablet TAKE 1 TABLET (25 MG TOTAL) BY MOUTH DAILY. 09/28/14  Yes [provider]  meclizine (ANTIVERT) 25 MG tablet Take 1 tablet (25 mg total) by mouth 3 (three) times daily as needed. 10/03/15  Yes Jene EveryKinner, Robert, MD  ibuprofen (ADVIL,MOTRIN) 200 MG tablet Take 200 mg by mouth as needed.    [provider]  NONFORMULARY OR COMPOUNDED ITEM [facility] compound:  (BDV) Tendinosis - Strictures - Scarring cream containing Baclofen 2%, Diclofenac 3%, Verapamil 10% dispense 180 grams +2 refills to apply to affected area 1-2 grams 3-4 times a day. Patient not taking: Reported on 05/17/2017 09/08/15   Vivi BarrackWagoner, Matthew R, DPM  Nutritional Supplements (ESTROVEN NIGHTTIME PO) Take by mouth daily.    [provider]    Allergies Morphine and Sulfa antibiotics  No family history on file.  Social History Social History  Substance Use Topics  . Smoking status: Never Smoker  . Smokeless tobacco: Never Used  . Alcohol use No    Review of Systems Constitutional: No fever/chills Eyes: No visual changes. ENT: No sore throat. Cardiovascular: +chest pain, episodic, first occurred at the beginning of the year,  then occurred again last night and today Respiratory: Denies shortness of breath. Gastrointestinal: No abdominal pain.  No nausea, no vomiting.  No diarrhea.  No constipation. Genitourinary: Negative for dysuria. Musculoskeletal: Negative for neck pain.  Negative for back pain. Integumentary: Negative for rash. Neurological: Negative for headaches, focal weakness or numbness.   ____________________________________________   PHYSICAL  EXAM:  ED Triage Vitals  Enc Vitals Group     BP 05/17/17 1630 134/83     Pulse Rate 05/17/17 1700 (!) 59     Resp 05/17/17 1730 13     Temp --      Temp src --      SpO2 05/17/17 1700 100 %     Weight 05/17/17 1427 72.6 kg (160 lb)     Height --      Head Circumference --      Peak Flow --      Pain Score 05/17/17 1426 8     Pain Loc --      Pain Edu? --      Excl. in GC? --     Constitutional: Alert and oriented. Well appearing and in no acute distress. Eyes: Conjunctivae are normal.  Head: Atraumatic. Nose: No congestion/rhinnorhea. Mouth/Throat: Mucous membranes are moist. Neck: No stridor.  No meningeal signs.   Cardiovascular: Normal rate, regular rhythm. Good peripheral circulation. Grossly normal heart sounds. Respiratory: Normal respiratory effort.  No retractions. Lungs CTAB. Gastrointestinal: Soft and nontender. No distention.  Musculoskeletal: No lower extremity tenderness nor edema. No gross deformities of extremities. Neurologic:  Normal speech and language. No gross focal neurologic deficits are appreciated.  Skin:  Skin is warm, dry and intact. No rash noted. Psychiatric: Mood and affect are normal. Speech and behavior are normal.  ____________________________________________   LABS (all labs ordered are listed, but only abnormal results are displayed)  Labs Reviewed  BASIC METABOLIC PANEL  CBC  TROPONIN I  TROPONIN I   ____________________________________________  EKG  ED ECG REPORT I, Zahniya Zellars, the attending physician, personally viewed and interpreted this ECG.  Date: 05/17/2017 EKG Time: 14:21 Rate: 62 Rhythm: normal sinus rhythm QRS Axis: normal Intervals: normal ST/T Wave abnormalities: normal Narrative Interpretation: unremarkable  ____________________________________________  RADIOLOGY   Dg Chest 2 View  Result Date: 05/17/2017 CLINICAL DATA:  Central chest pain for 2 days.  Hypertension. EXAM: CHEST  2 VIEW COMPARISON:   None. FINDINGS: The heart size and mediastinal contours are within normal limits. Both lungs are clear. The visualized skeletal structures are unremarkable. IMPRESSION: Negative.  No active cardiopulmonary disease. Electronically Signed   By: Myles Rosenthal M.D.   On: 05/17/2017 14:56    ____________________________________________   PROCEDURES  Critical Care performed: No   Procedure(s) performed:   Procedures   ____________________________________________   INITIAL IMPRESSION / ASSESSMENT AND PLAN / ED COURSE  Pertinent labs & imaging results that were available during my care of the patient were reviewed by me and considered in my medical decision making (see chart for details).  the patient may very well be suffering from angina but she is asymptomatic now and has a normal EKG in a normal set of lab work.  Her HEART score is 3-4 based on the subjective question of whether her history is "moderately" or "highly" suspicious.  She has a Wells score for PE of zero.  I had my usual ACS spectrum discussion with the patient and her husband, and she is obviously involved in her own medical care, intelligent, and reliable  for follow-up. I gave her the option of coming into the hospital or following up as an outpatient particularly if her second troponin is negative and she far prefers to go as an outpatient although she did agree to do whatever I suggested was best. However because she is a reliable historian and I believe she will be appropriate for close follow-up and because she can generally be seen within the next several days, I think close  cardiology follow-up is appropriate. I gave the patient three baby aspirin (she Artie takes one daily baby aspirin) and we will check a second troponin. As long as she stays comfortable I will provide her with information for cardiology follow-up. She and her husband understand and agree with the plan.  Clinical Course as of May 17 2336  Thu May 17, 2017  1900 second troponin is negative and patient is asymptomatic.  She is comfortable with the plan for outpatient follow-up and in fact does not want to stay in the hospital; as documented above I did offer her admission initially).  the patient notes she needs to follow up at the next available opportunity with the cardiologist(s) provided in the discharge papers.  I gave my usual and customary return precautions.     [CF]    Clinical Course User Index [CF] Loleta Rose, MD    ____________________________________________  FINAL CLINICAL IMPRESSION(S) / ED DIAGNOSES  Final diagnoses:  Chest pain, unspecified type     MEDICATIONS GIVEN DURING THIS VISIT:  Medications  aspirin chewable tablet 243 mg (243 mg Oral Given 05/17/17 1814)     NEW OUTPATIENT MEDICATIONS STARTED DURING THIS VISIT:  Discharge Medication List as of 05/17/2017  7:03 PM      Discharge Medication List as of 05/17/2017  7:03 PM      Discharge Medication List as of 05/17/2017  7:03 PM       Note:  This document was prepared using Dragon voice recognition software and may include unintentional dictation errors.    Loleta Rose, MD 05/17/17 2337

## 2017-08-26 IMAGING — CT CT HEAD W/O CM
1 series · 16 of 30 positions shown, 20 images · non-contrast
Comparison: None.

CLINICAL DATA: Vertigo for several days

EXAM:
CT HEAD WITHOUT CONTRAST
TECHNIQUE: Contiguous axial images were obtained from the base of the skull
through the vertex without intravenous contrast.

[Series 2: head wo · axial · 0.40mm/px · z∈[-164,-38]mm · 16 of 32 slices shown, 20 images]
[im 2/32  brain]
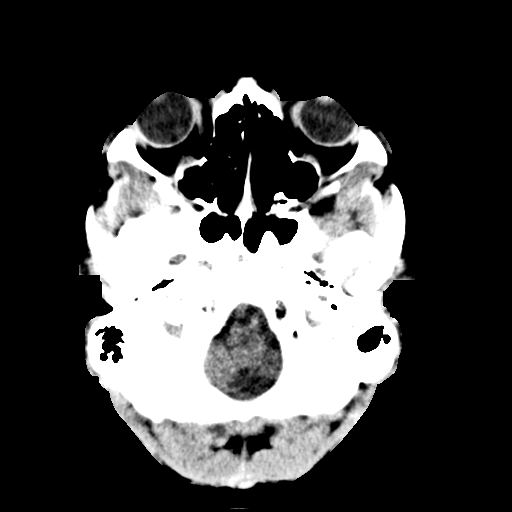
[im 2/32  bone]
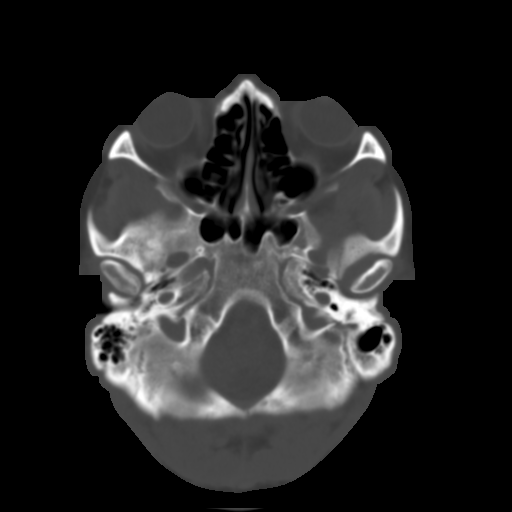
[im 4/32  brain]
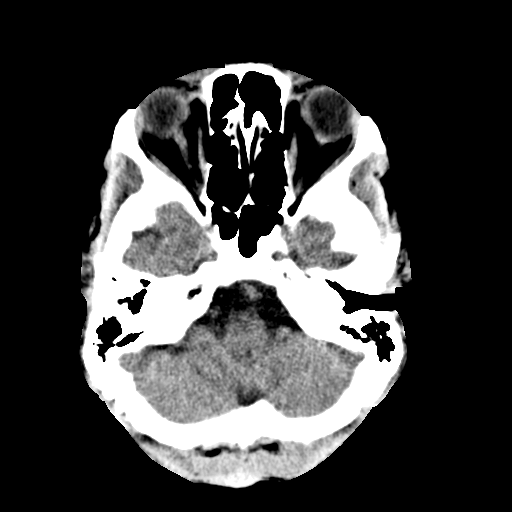
[im 6/32  brain]
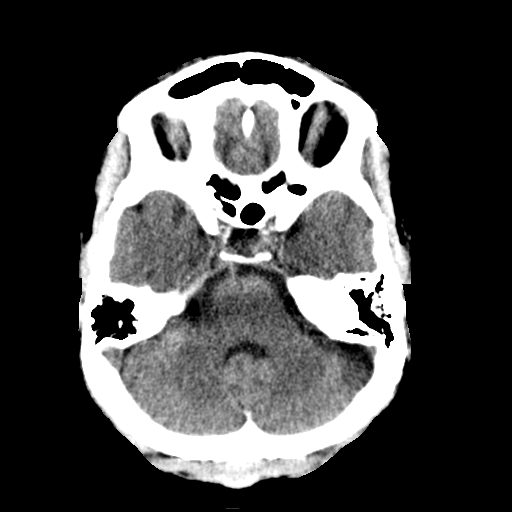
[im 8/32  brain]
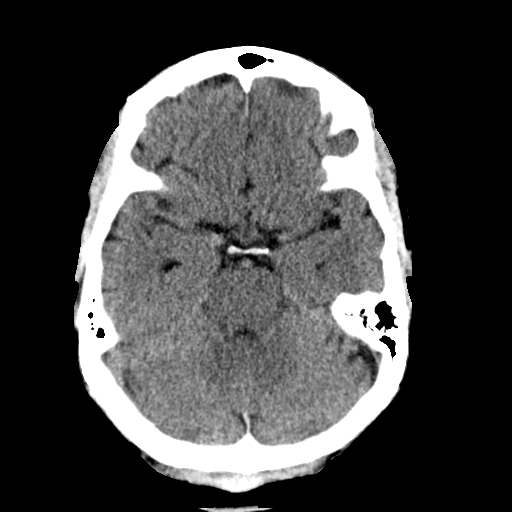
[im 9/32  brain]
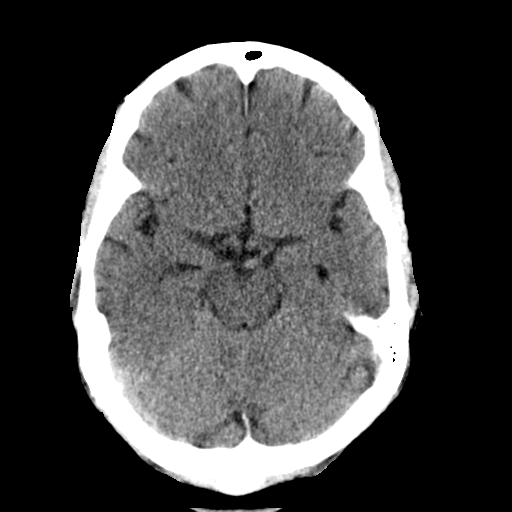
[im 9/32  bone]
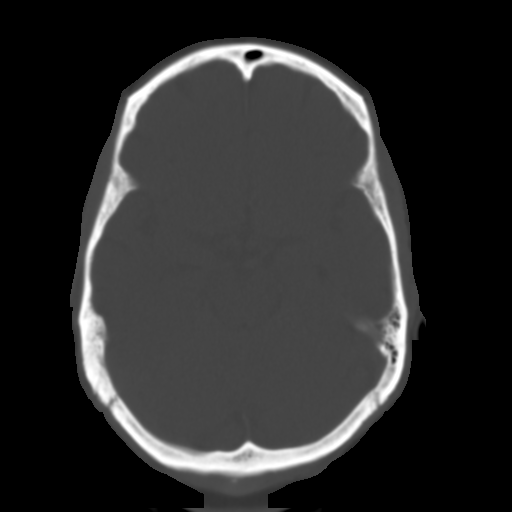
[im 11/32  brain]
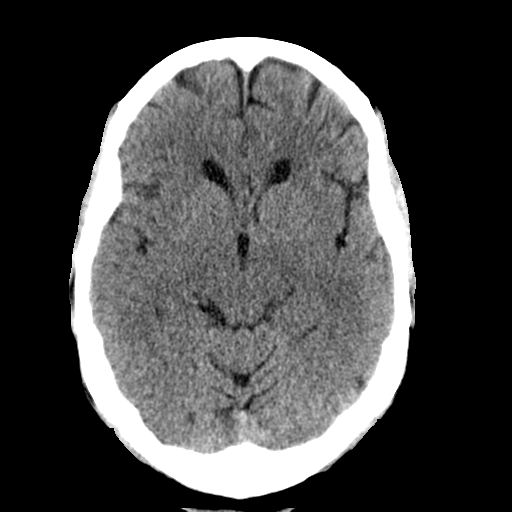
[im 13/32  brain]
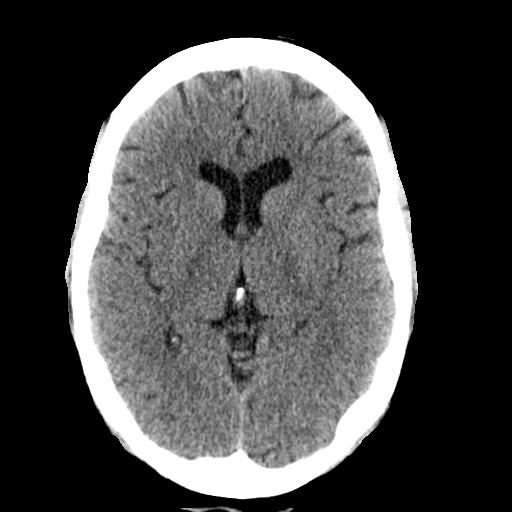
[im 15/32  brain]
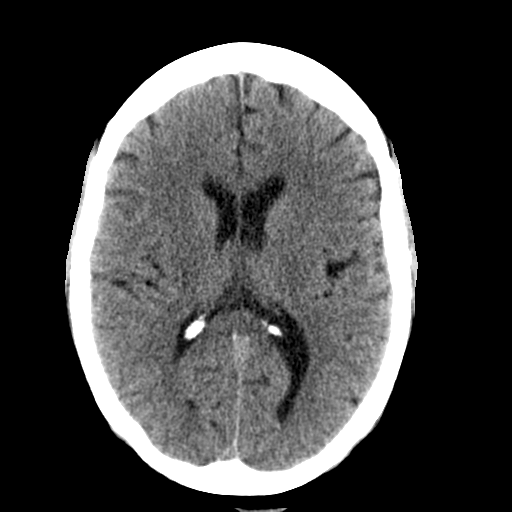
[im 17/32  brain]
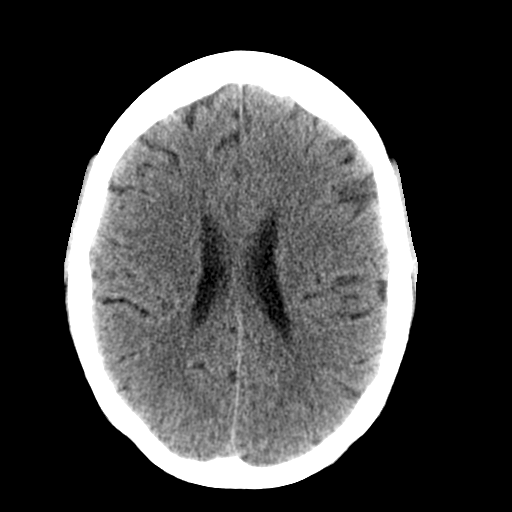
[im 17/32  bone]
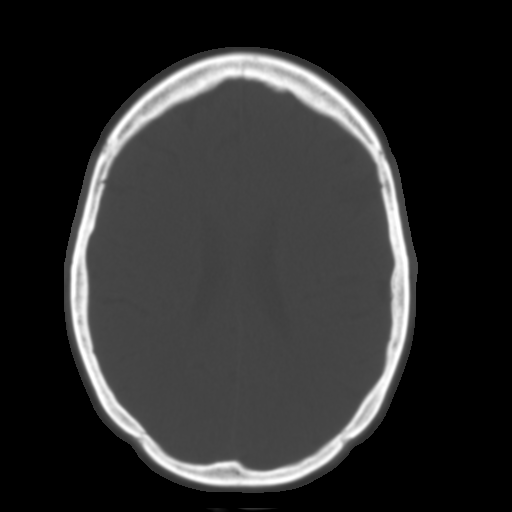
[im 19/32  brain]
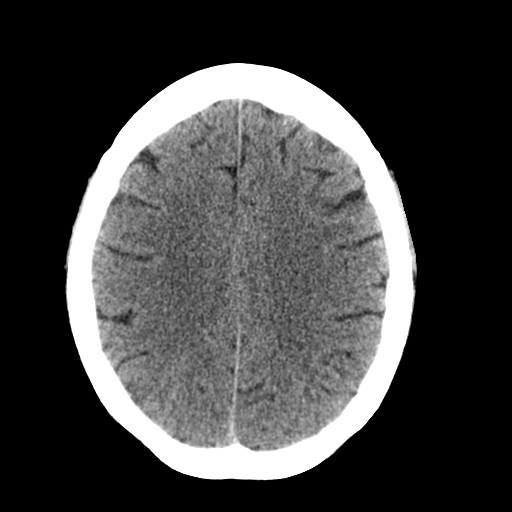
[im 21/32  brain]
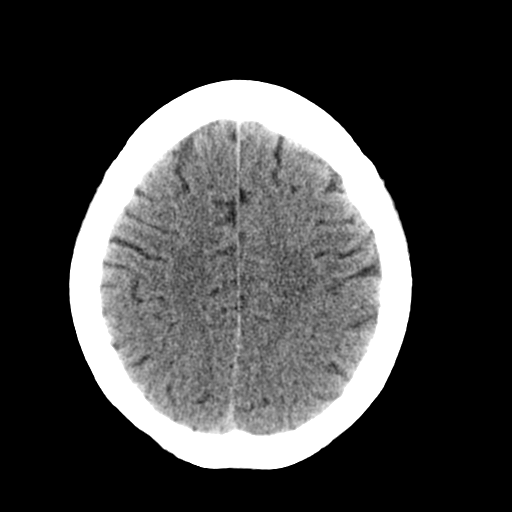
[im 23/32  brain]
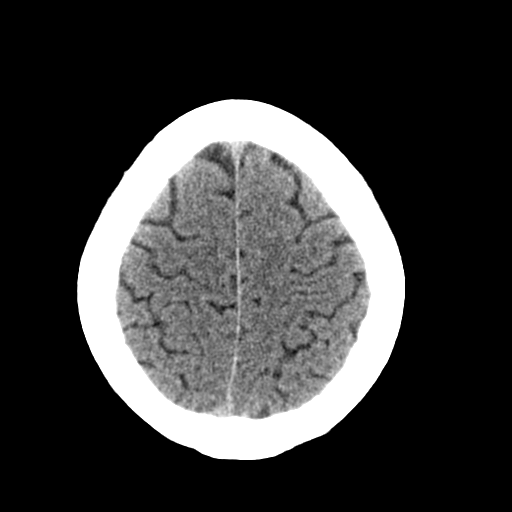
[im 24/32  brain]
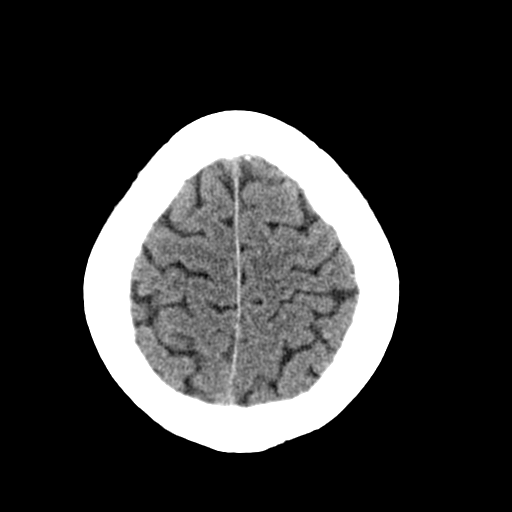
[im 24/32  bone]
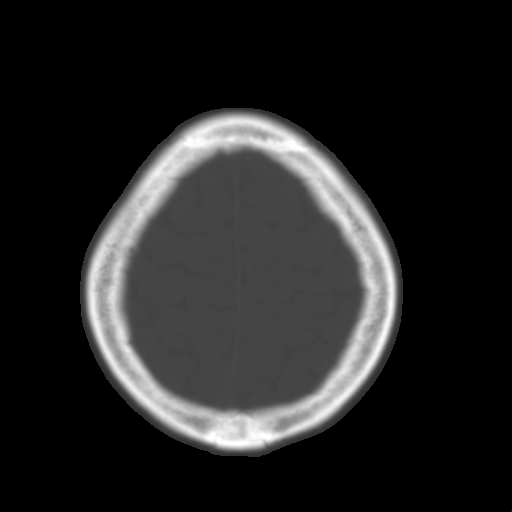
[im 26/32  brain]
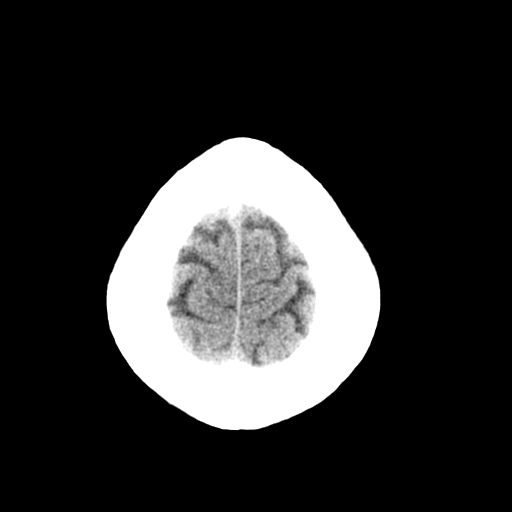
[im 28/32  brain]
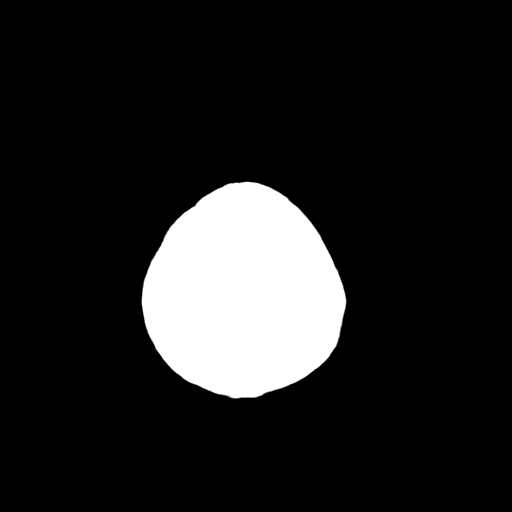
[im 30/32  brain]
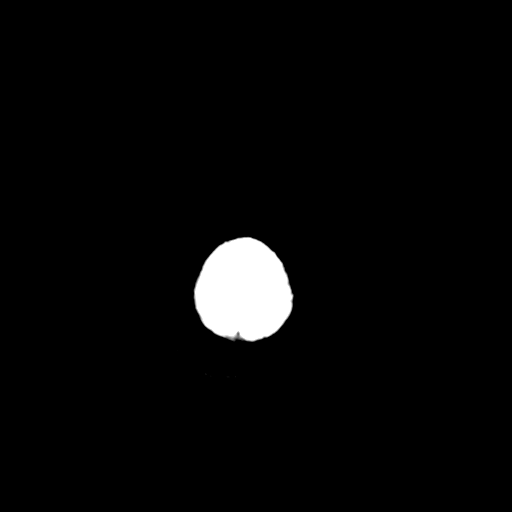

[16 of 30 positions shown; findings below may reference images not displayed]

FINDINGS: No skull fracture is noted. Paranasal sinuses and mastoid air cells
are unremarkable. No intracranial hemorrhage, mass effect or midline
shift.

No acute cortical infarction. No mass lesion is noted on this
unenhanced scan. The gray and white-matter differentiation is
preserved. No hydrocephalus.
IMPRESSION: No acute intracranial abnormality.

## 2017-10-03 IMAGING — MR MR BRAIN/IAC WO/W
10 of 11 series · 40 of 48 positions shown · IV contrast (multihance)
Comparison: Head CT 10/03/2015

CLINICAL DATA: Dizziness. Episodes of vertigo 2 weeks before
[REDACTED] and again on [REDACTED]. Intermittent headaches.
Similar symptoms 6 years ago. Left-sided head pressure.

EXAM:
MR BRAIN/IAC WITHOUT AND WITH CONTRAST
TECHNIQUE: Multiplanar, multisequence MR imaging was performed both before and
after administration of intravenous contrast.
CONTRAST:  15mL MULTIHANCE GADOBENATE DIMEGLUMINE 529 MG/ML IV SOLN

[Series 2: T1 · sagittal · 5.0mm · 0.45mm/px · 4 of 23 slices shown (1 of 3)]
[im 1/23]
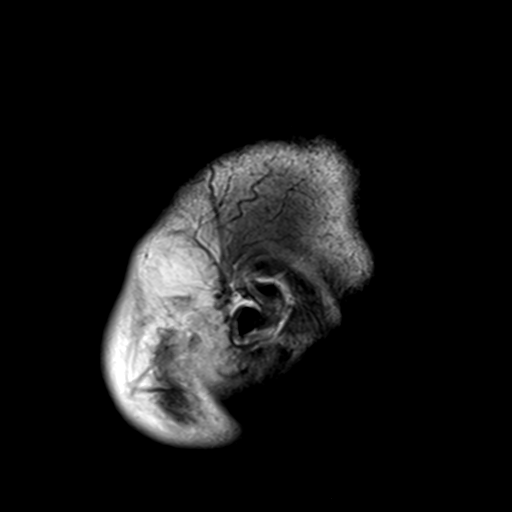
[im 8/23]
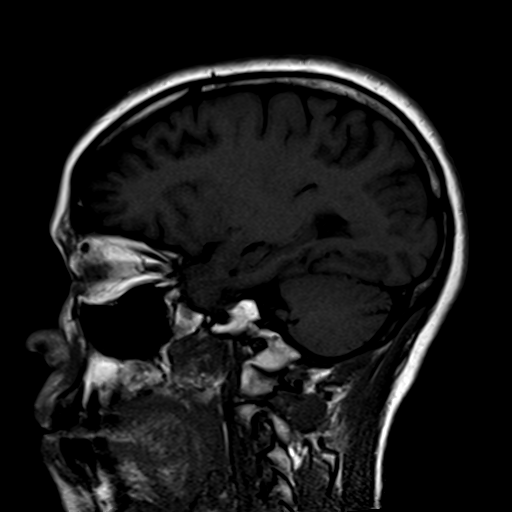
[im 15/23]
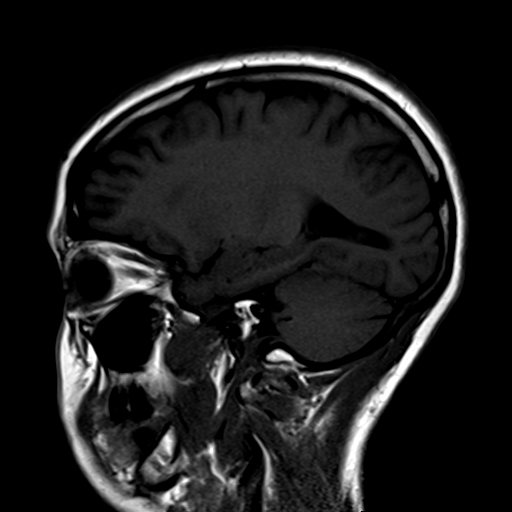
[im 23/23]
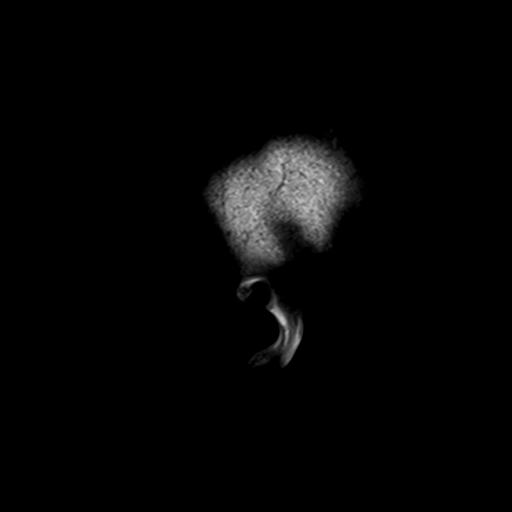

[Series 4: DWI · axial · 3.0mm · 1.20mm/px · z∈[-57,+116]mm · 8 of 59 slices shown (1 of 2)]
[im 1/59]
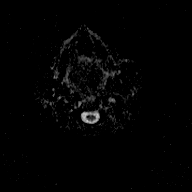
[im 9/59]
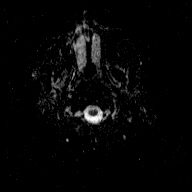
[im 17/59]
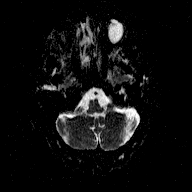
[im 25/59]
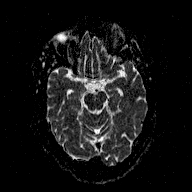
[im 34/59]
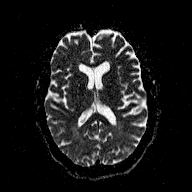
[im 42/59]
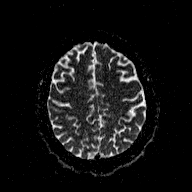
[im 50/59]
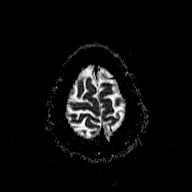
[im 59/59]
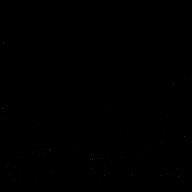

[Series 5: T2 · axial · 5.0mm · 0.72mm/px · z∈[-53,+115]mm · 4 of 27 slices shown]
[im 1/27]
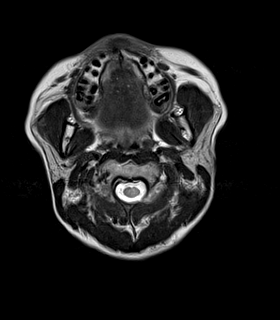
[im 9/27]
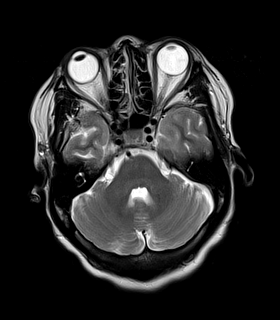
[im 18/27]
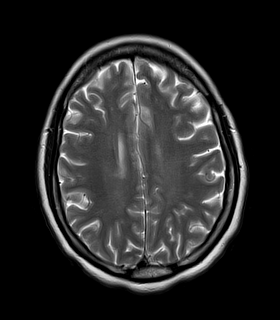
[im 27/27]
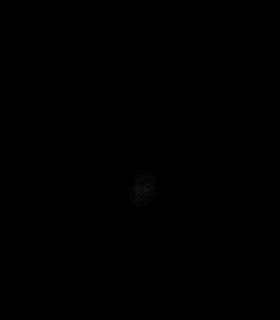

[Series 6: FLAIR · axial · 5.0mm · 0.45mm/px · z∈[-53,+115]mm · 4 of 27 slices shown]
[im 1/27]
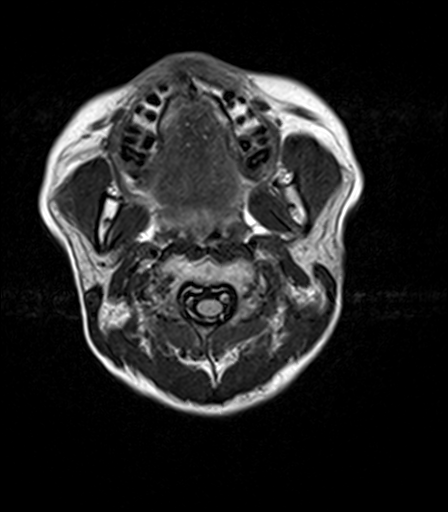
[im 9/27]
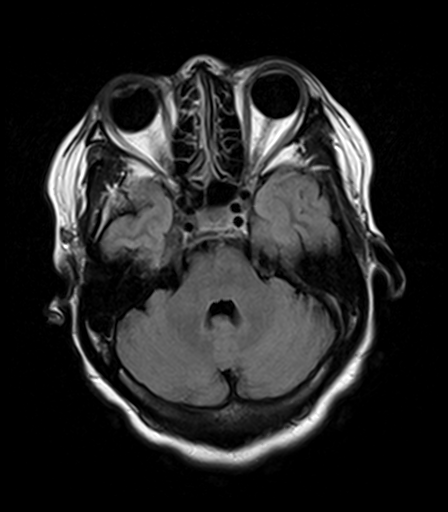
[im 18/27]
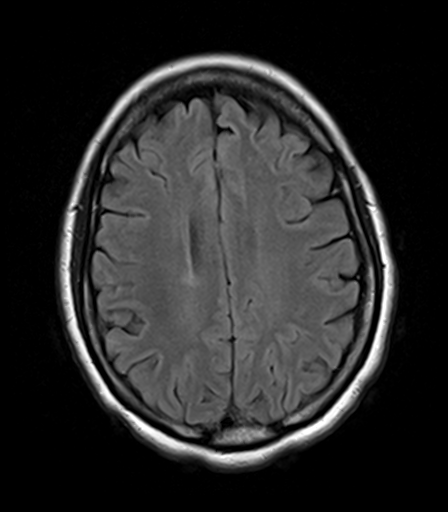
[im 27/27]
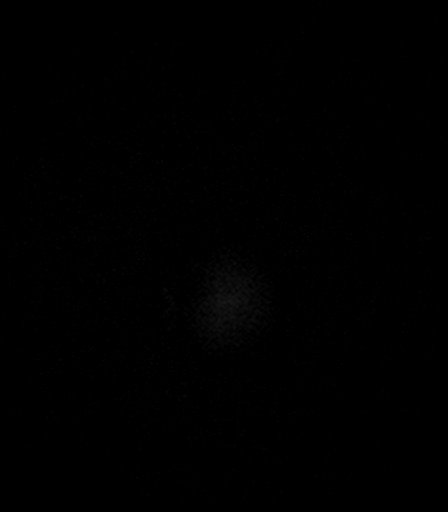

[Series 7: T1 · coronal · 3.0mm · 0.37mm/px · 1 of 11 slices shown (2 of 3)]
[im 1/11]
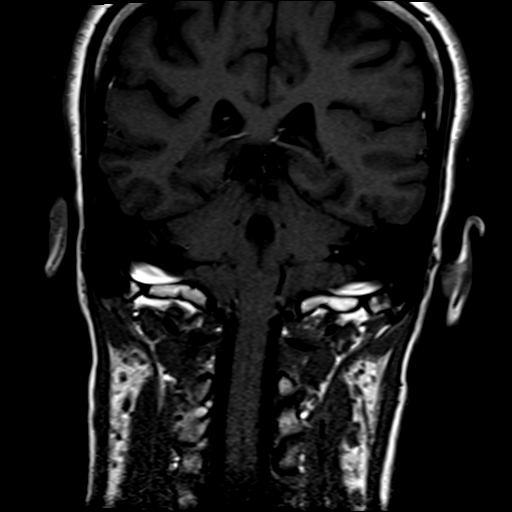

[Series 9: T1 · axial · 3.0mm · 0.37mm/px · 1 of 11 slices shown (3 of 3)]
[im 1/11]
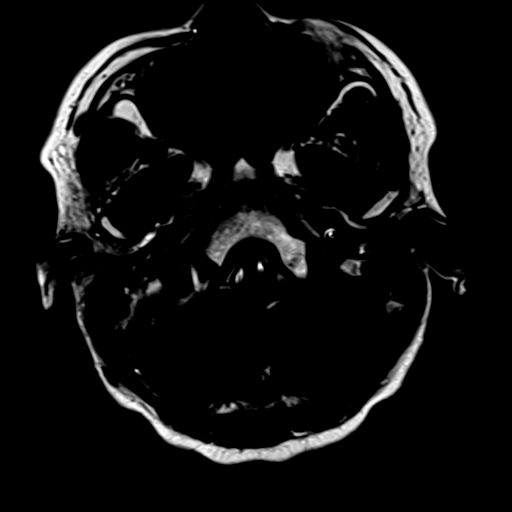

[Series 10: T1 post-contrast · axial · 3.0mm · 0.37mm/px · 1 of 11 slices shown (1 of 3)]
[im 1/11]
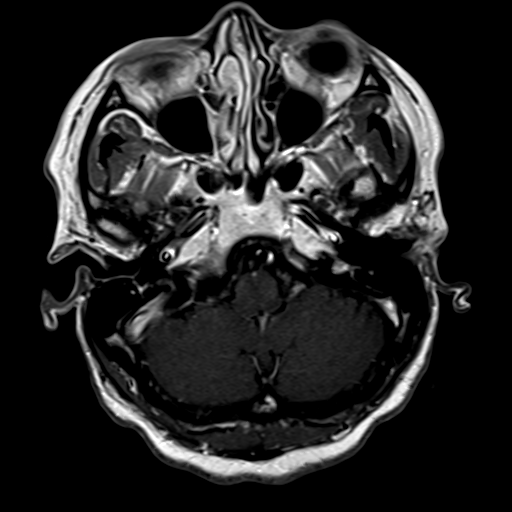

[Series 11: T1 post-contrast · coronal · 3.0mm · 0.37mm/px · 1 of 11 slices shown (2 of 3)]
[im 1/11]
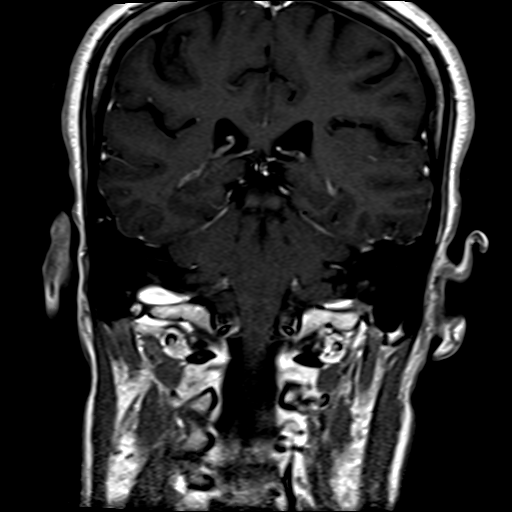

[Series 12: T1 post-contrast · axial · 3.0mm · 1.00mm/px · z∈[-56,+120]mm · 8 of 60 slices shown (3 of 3)]
[im 1/60]
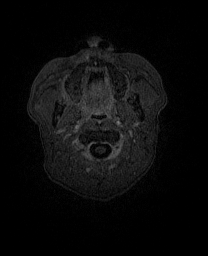
[im 9/60]
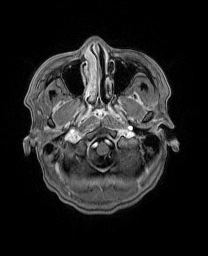
[im 17/60]
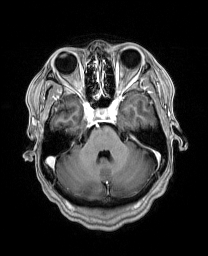
[im 26/60]
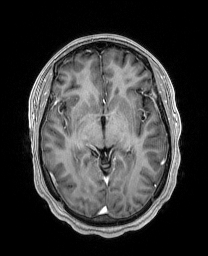
[im 34/60]
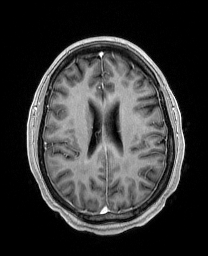
[im 43/60]
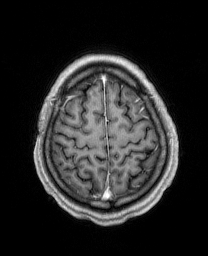
[im 51/60]
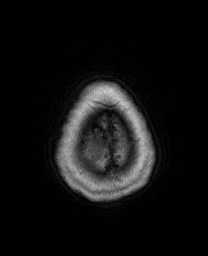
[im 60/60]
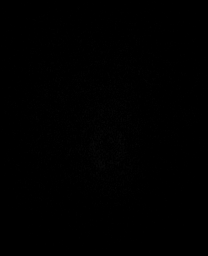

[Series 100: DWI · axial · 3.0mm · 1.20mm/px · z∈[-57,+116]mm · 8 of 59 slices shown (2 of 2)]
[im 1/59]
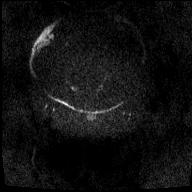
[im 9/59]
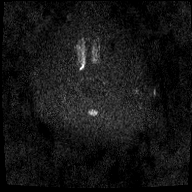
[im 17/59]
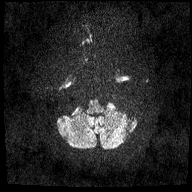
[im 25/59]
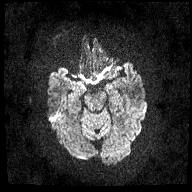
[im 34/59]
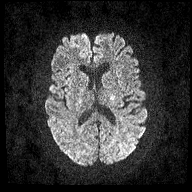
[im 42/59]
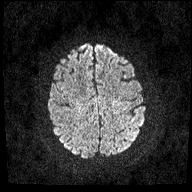
[im 50/59]
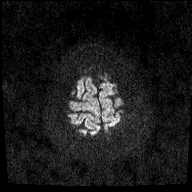
[im 59/59]
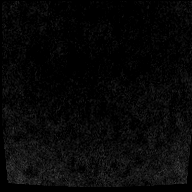

[40 of 48 positions shown; findings below may reference images not displayed]

FINDINGS: There is no evidence of acute infarct, intracranial hemorrhage,
mass, midline shift, or extra-axial fluid collection. Ventricles and
sulci are normal. Periventricular white matter T2 hyperintensities
are nonspecific but compatible with minimal chronic small vessel
ischemic disease. No abnormal enhancement is identified.

Orbits are unremarkable. Paranasal sinuses and mastoid air cells are
clear. Major intracranial vascular flow voids are preserved.

Dedicated imaging through the internal auditory canals demonstrates
a normal course of cranial nerves VII and VIII without evidence of
mass or abnormal enhancement. Inner ear structures demonstrate
normal signal bilaterally. No mass is seen within the
cerebellopontine angles.
IMPRESSION: 1. Unremarkable appearance of the internal auditory canals. No
etiology of vertigo identified.
2. No acute intracranial abnormality or mass.

## 2018-05-09 ENCOUNTER — Emergency Department: Payer: 59

## 2018-05-09 ENCOUNTER — Encounter: Payer: Self-pay | Admitting: Emergency Medicine

## 2018-05-09 ENCOUNTER — Other Ambulatory Visit: Payer: Self-pay

## 2018-05-09 ENCOUNTER — Emergency Department
Admission: EM | Admit: 2018-05-09 | Discharge: 2018-05-09 | Disposition: A | Discharge status: Home / Self Care | Payer: 59 | Attending: Emergency Medicine | Admitting: Emergency Medicine

## 2018-05-09 DIAGNOSIS — R0789 Other chest pain: Secondary | ICD-10-CM | POA: Diagnosis not present

## 2018-05-09 DIAGNOSIS — I10 Essential (primary) hypertension: Secondary | ICD-10-CM | POA: Diagnosis not present

## 2018-05-09 DIAGNOSIS — M940 Chondrocostal junction syndrome [Tietze]: Secondary | ICD-10-CM | POA: Diagnosis not present

## 2018-05-09 DIAGNOSIS — Z7982 Long term (current) use of aspirin: Secondary | ICD-10-CM | POA: Insufficient documentation

## 2018-05-09 DIAGNOSIS — Z79899 Other long term (current) drug therapy: Secondary | ICD-10-CM | POA: Insufficient documentation

## 2018-05-09 DIAGNOSIS — R079 Chest pain, unspecified: Secondary | ICD-10-CM | POA: Diagnosis present

## 2018-05-09 LAB — CBC
HCT: 36.4 % (ref 35.0–47.0)
HEMOGLOBIN: 12.4 g/dL (ref 12.0–16.0)
MCH: 30.6 pg (ref 26.0–34.0)
MCHC: 34 g/dL (ref 32.0–36.0)
MCV: 90 fL (ref 80.0–100.0)
PLATELETS: 227 10*3/uL (ref 150–440)
RBC: 4.04 MIL/uL (ref 3.80–5.20)
RDW: 13.7 % (ref 11.5–14.5)
WBC: 7 10*3/uL (ref 3.6–11.0)

## 2018-05-09 LAB — BASIC METABOLIC PANEL
ANION GAP: 5 (ref 5–15)
BUN: 16 mg/dL (ref 6–20)
CHLORIDE: 102 mmol/L (ref 98–111)
CO2: 32 mmol/L (ref 22–32)
CREATININE: 1.14 mg/dL — AB (ref 0.44–1.00)
Calcium: 9.4 mg/dL (ref 8.9–10.3)
GFR calc non Af Amer: 52 mL/min — ABNORMAL LOW (ref >60)
Glucose, Bld: 106 mg/dL — ABNORMAL HIGH (ref 70–99)
POTASSIUM: 3.6 mmol/L (ref 3.5–5.1)
SODIUM: 139 mmol/L (ref 135–145)

## 2018-05-09 LAB — TROPONIN I: Troponin I: <0.03 ng/mL (ref <0.03)

## 2018-05-09 NOTE — Discharge Instructions (Signed)

## 2018-05-09 NOTE — ED Triage Notes (Addendum)
Patient to ER for midsternal chest pain that began yesterday. Patient states pain subsides with laying down/resting, but patient states chest pain returns as soon as she gets up and with exertion. Denies any shortness of breath, nausea, or diaphoresis. Patient reports "It feels like there's a knot in my chest.". Patient is nonsmoker, denies any h/o cardiac disease/cholesterol problems. +HTN history.

## 2018-05-09 NOTE — ED Notes (Signed)
MD at the bedside for pt evaluation  

## 2018-05-09 NOTE — ED Provider Notes (Signed)
[facility] Emergency Department Provider Note  ____________________________________________   First MD Initiated Contact with Patient 05/09/18 [phone]     (approximate)  I have reviewed the triage vital signs and the nursing notes.   HISTORY  Chief Complaint Chest Pain    HPI Kelli Hernandez is a 57 y.o. female whose medical history only includes hypertension who presents for evaluation of midsternal highly reproducible chest pain and tenderness.  She reports that she feels like she may have pulled a muscle but she was getting concerned because it seems to be getting worse over the last 2 days.  The pain is highly reproducible with palpation of the upper anterior chest from the center to the left side as well as with movement of her torso, sitting up and sitting down, and even moving and extending her arms.  She has had no shortness of breath.  She denies fever/chills, nausea, vomiting, abdominal pain, diarrhea, constipation, dysuria.  She has not had similar symptoms in the past.  She uses estradiol, has had no recent immobilizations or surgeries, no active neoplasm, no history of blood clots in the legs of the lungs, no unilateral leg pain or swelling.  No family history of blood clots nor first-degree relatives with ACS.  She does not have diabetes, tobacco use, nor hypercholesterolemia.  She describes the pain is severe with palpation and mild with movement.    Past Medical History:  Diagnosis Date  . Hypertension     There are no active problems to display for this patient.   Past Surgical History:  Procedure Laterality Date  . MYOMECTOMY      Prior to Admission medications   Medication Sig Start Date End Date Taking? Authorizing Provider  Acetaminophen 325 MG CAPS Take 3 capsules by mouth daily.    [provider]  amLODipine (NORVASC) 5 MG tablet Take 5 mg by mouth. 04/02/14 05/17/17  [provider]  aspirin EC 81 MG tablet Take 81 mg  by mouth daily.    [provider]  citalopram (CELEXA) 20 MG tablet Take by mouth.    [provider]  hydrochlorothiazide (HYDRODIURIL) 25 MG tablet TAKE 1 TABLET (25 MG TOTAL) BY MOUTH DAILY. 09/28/14   [provider]  ibuprofen (ADVIL,MOTRIN) 200 MG tablet Take 200 mg by mouth as needed.    [provider]  meclizine (ANTIVERT) 25 MG tablet Take 1 tablet (25 mg total) by mouth 3 (three) times daily as needed. 10/03/15   Jene Every, MD  NONFORMULARY OR COMPOUNDED ITEM [facility] compound:  (BDV) Tendinosis - Strictures - Scarring cream containing Baclofen 2%, Diclofenac 3%, Verapamil 10% dispense 180 grams +2 refills to apply to affected area 1-2 grams 3-4 times a day. Patient not taking: Reported on 05/17/2017 09/08/15   Vivi Barrack, DPM  Nutritional Supplements (ESTROVEN NIGHTTIME PO) Take by mouth daily.    [provider]    Allergies Morphine and Sulfa antibiotics  No family history on file.  Social History Social History   Tobacco Use  . Smoking status: Never Smoker  . Smokeless tobacco: Never Used  Substance Use Topics  . Alcohol use: No    Alcohol/week: 0.0 oz  . Drug use: Not on file    Review of Systems Constitutional: No fever/chills Eyes: No visual changes. ENT: No sore throat. Cardiovascular: Anterior reproducible chest wall tenderness to palpation and with movement Respiratory: Denies shortness of breath. Gastrointestinal: No abdominal pain.  No nausea, no vomiting.  No diarrhea.  No constipation. Genitourinary: Negative for dysuria. Musculoskeletal:  Integumentary: Negative for rash. Neurological: Negative for headaches, focal weakness or numbness.   ____________________________________________   PHYSICAL EXAM:  VITAL SIGNS: ED Triage Vitals  Enc Vitals Group     BP 05/09/18 0040 140/83     Pulse Rate 05/09/18 0040 (!) 59     Resp 05/09/18 0040 20     Temp 05/09/18 0040 98.7 F (37.1 C)       Temp Source 05/09/18 0040 Oral     SpO2 05/09/18 0040 97 %     Weight 05/09/18 0039 68 kg (150 lb)     Height 05/09/18 0039 1.715 m (5' 7.5")     Head Circumference --      Peak Flow --      Pain Score 05/09/18 0038 9     Pain Loc --      Pain Edu? --      Excl. in GC? --     Constitutional: Alert and oriented. Well appearing and in no acute distress. Eyes: Conjunctivae are normal.  Head: Atraumatic. Nose: No congestion/rhinnorhea. Mouth/Throat: Mucous membranes are moist. Neck: No stridor.  No meningeal signs.   Cardiovascular: Normal rate, regular rhythm. Good peripheral circulation. Grossly normal heart sounds.  Highly reproducible anterior chest tenderness to palpation of the sternum and left upper anterior chest. Respiratory: Normal respiratory effort.  No retractions. Lungs CTAB. Gastrointestinal: Soft and nontender. No distention.  Musculoskeletal: No lower extremity tenderness nor edema. No gross deformities of extremities. Neurologic:  Normal speech and language. No gross focal neurologic deficits are appreciated.  Skin:  Skin is warm, dry and intact. No rash noted. Psychiatric: Mood and affect are normal. Speech and behavior are normal.  ____________________________________________   LABS (all labs ordered are listed, but only abnormal results are displayed)  Labs Reviewed  BASIC METABOLIC PANEL - Abnormal; Notable for the following components:      Result Value   Glucose, Bld 106 (*)    Creatinine, Ser 1.14 (*)    GFR calc non Af Amer 52 (*)    All other components within normal limits  CBC  TROPONIN I   ____________________________________________  EKG  ED ECG REPORT I, Loleta Roseory Jordany Russett, the attending physician, personally viewed and interpreted this ECG.  Date: 05/09/2018 EKG Time: 00: 40 Rate: 58 Rhythm: Mild sinus bradycardia QRS Axis: normal Intervals: normal ST/T Wave abnormalities: normal Narrative Interpretation: no evidence of acute  ischemia  ____________________________________________  RADIOLOGY I, Loleta Roseory December Hedtke, personally viewed and evaluated these images (plain radiographs) as part of my medical decision making, as well as reviewing the written report by the radiologist.  ED MD interpretation: No acute abnormalities on chest x-ray  Official radiology report(s): Dg Chest 2 View  Result Date: 05/09/2018 CLINICAL DATA:  Chest pain EXAM: CHEST - 2 VIEW COMPARISON:  05/17/2017 FINDINGS: Lungs are clear.  No pleural effusion or pneumothorax. The heart is normal in size. Visualized osseous structures are within normal limits. IMPRESSION: Normal chest radiographs. Electronically Signed   By: Charline BillsSriyesh  Krishnan M.D.   On: 05/09/2018 01:26    ____________________________________________   PROCEDURES  Critical Care performed: No   Procedure(s) performed:   Procedures   ____________________________________________   INITIAL IMPRESSION / ASSESSMENT AND PLAN / ED COURSE  As part of my medical decision making, I reviewed the following data within the electronic MEDICAL RECORD NUMBER Nursing notes reviewed and incorporated, Labs reviewed , EKG interpreted , Old chart reviewed and Radiograph  reviewed     Differential diagnosis includes, but is not limited to, musculoskeletal chest wall tenderness, costochondritis, ACS, PE, pneumonia.  Patient's vital signs are stable.  She has no signs or symptoms of infectious disease.  Chest x-ray and EKG are reassuring.  Troponin is negative, CBC and basic metabolic panel normal.  She is low risk for ACS based on HEART score and she has a Wells score for PE of 0.  She has highly reproducible chest wall tenderness.  I do not feel that a repeat troponin will add anything to the evaluation.  I discussed the diagnosis of chest wall pain and costochondritis with her.  She agrees with the plan for Tylenol and ibuprofen 600 mg 3 times a day with meals and close outpatient follow-up.  I gave my  usual and customary return precautions.    ____________________________________________  FINAL CLINICAL IMPRESSION(S) / ED DIAGNOSES  Final diagnoses:  Chest wall pain  Costochondritis     MEDICATIONS GIVEN DURING THIS VISIT:  Medications - No data to display   ED Discharge Orders    None       Note:  This document was prepared using Dragon voice recognition software and may include unintentional dictation errors.    Loleta Rose, MD 05/09/18 [phone]

## 2019-04-10 IMAGING — CR DG CHEST 2V
1 series · 2 of 2 positions shown · non-contrast
Comparison: None.

CLINICAL DATA: Central chest pain for 2 days.  Hypertension.

EXAM:
CHEST  2 VIEW

[Series 1: w chest pa · 0.14mm/px · 2 of 2 slices shown]
[im 1/2]
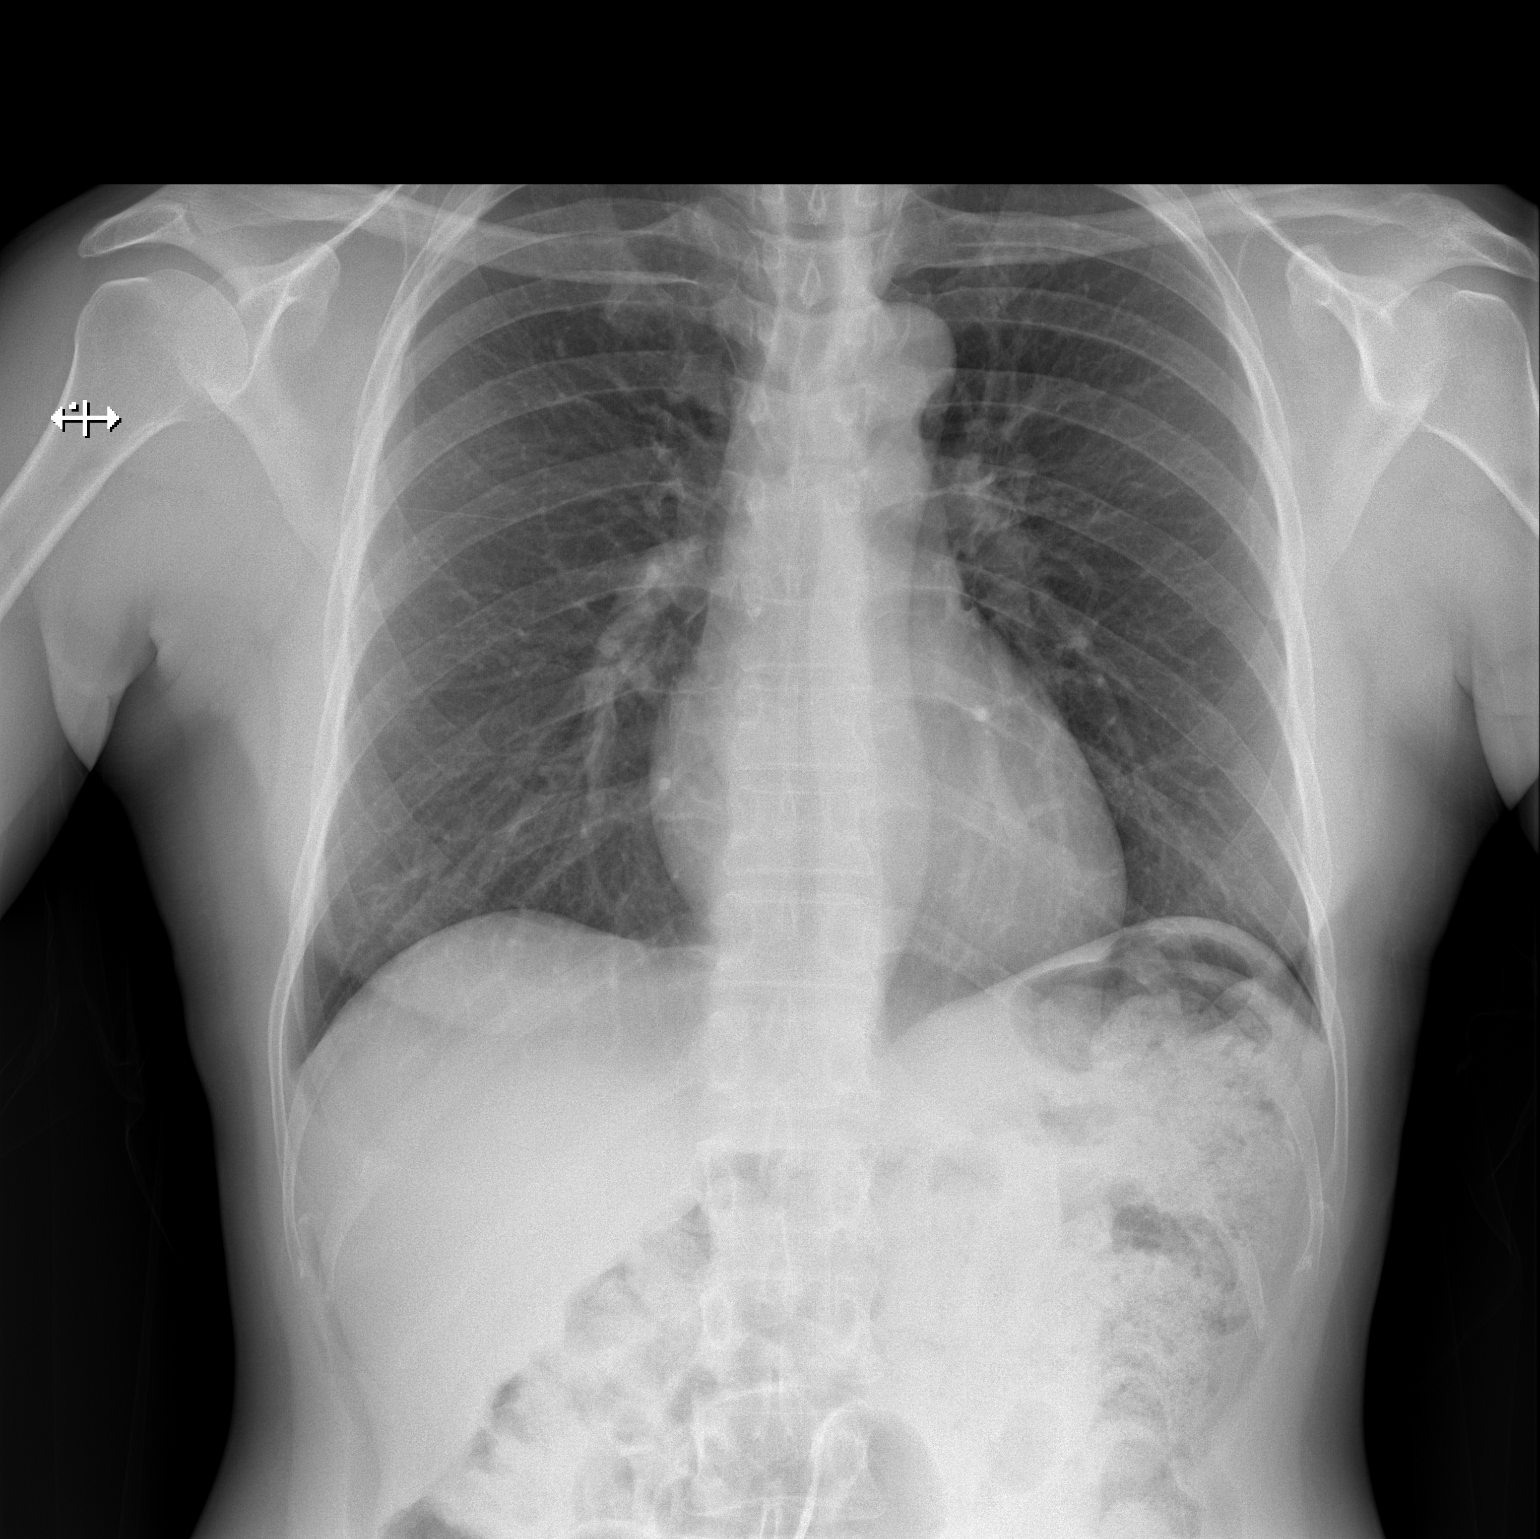
[im 2/2]
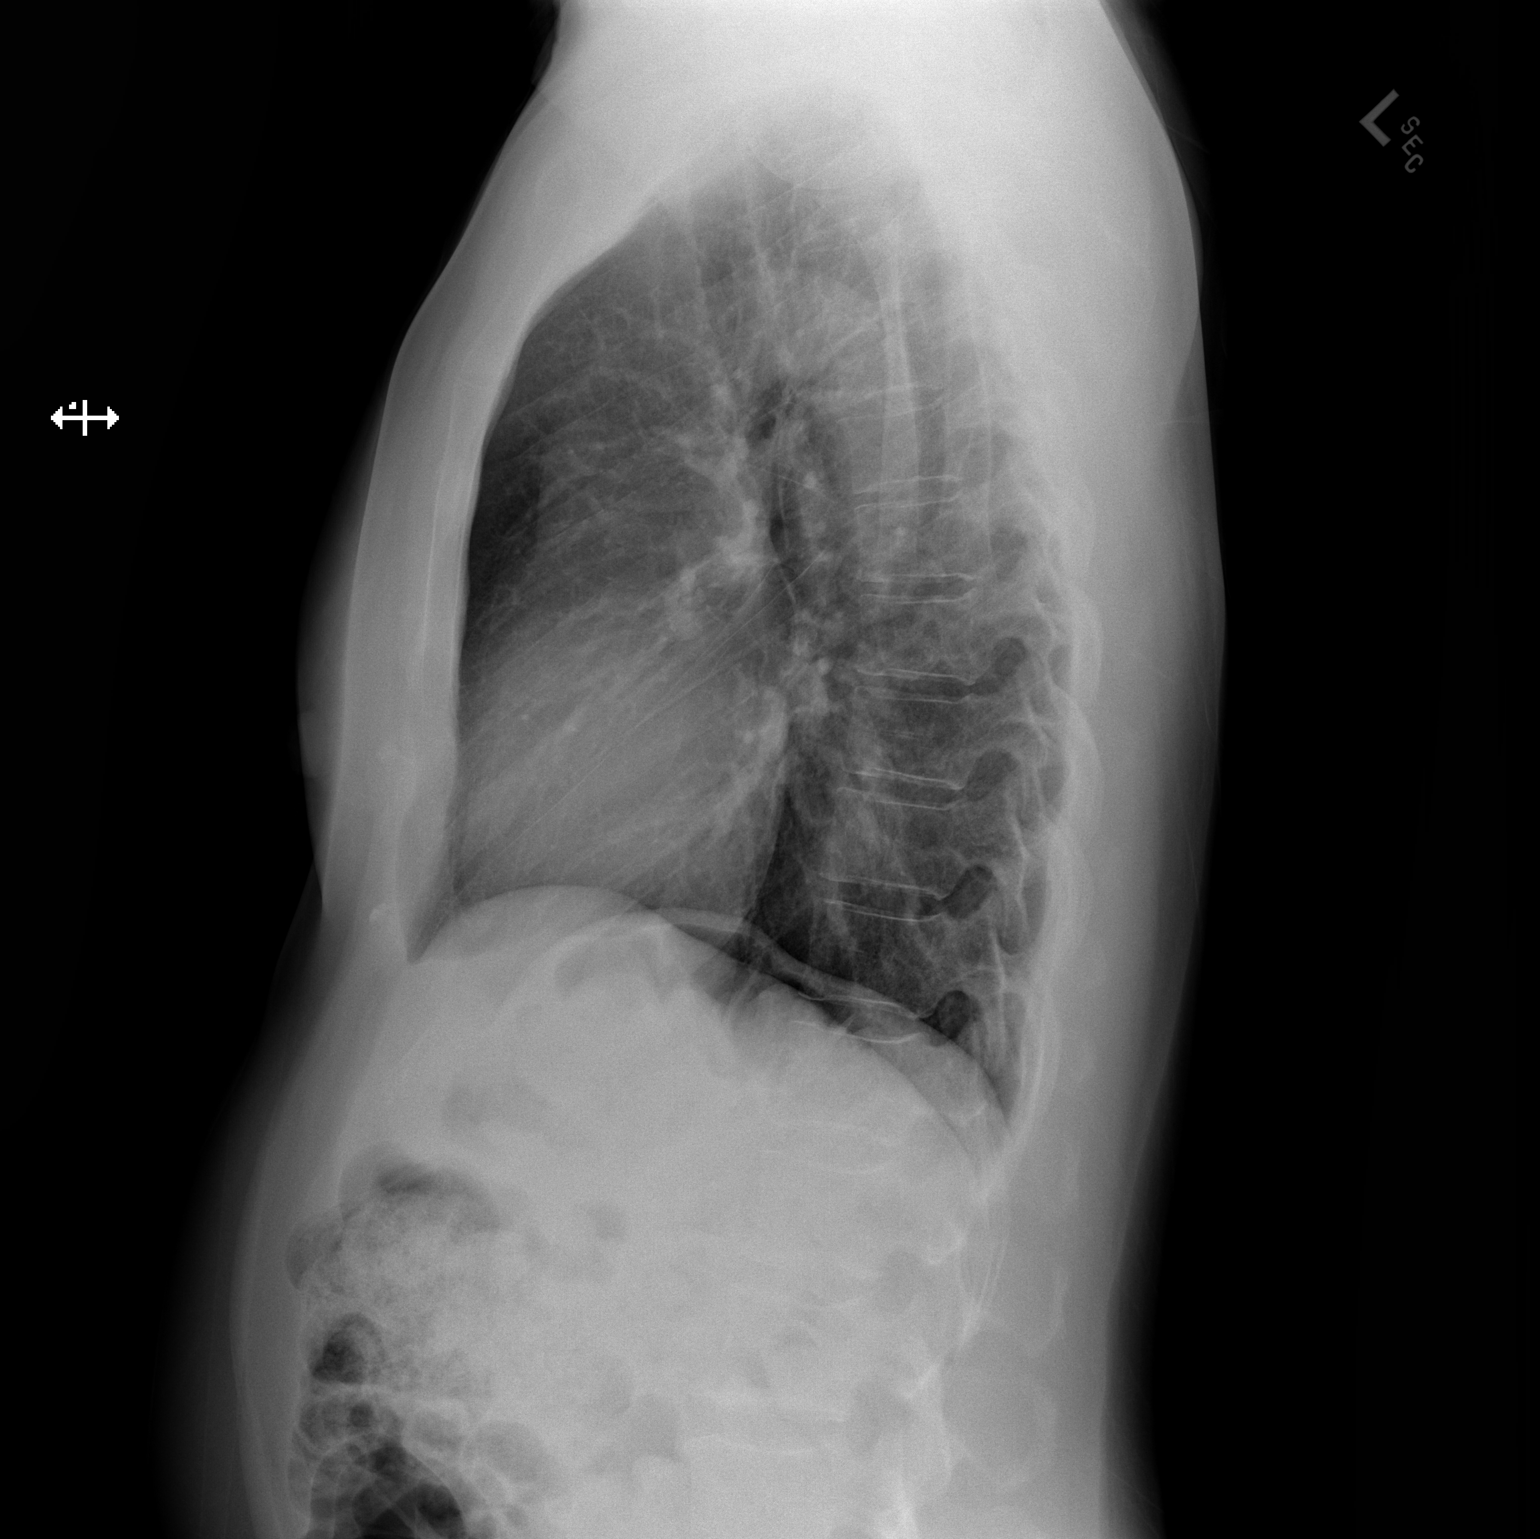

[2 of 2 positions shown; findings below may reference images not displayed]

FINDINGS: The heart size and mediastinal contours are within normal limits.
Both lungs are clear. The visualized skeletal structures are
unremarkable.
IMPRESSION: Negative.  No active cardiopulmonary disease.

## 2019-09-02 ENCOUNTER — Other Ambulatory Visit: Payer: Self-pay | Admitting: *Deleted

## 2019-09-02 ENCOUNTER — Other Ambulatory Visit: Payer: Self-pay

## 2019-09-02 DIAGNOSIS — Z20822 Contact with and (suspected) exposure to covid-19: Secondary | ICD-10-CM

## 2019-09-02 NOTE — Addendum Note (Signed)
Addended by: Alan Mulder on: 09/02/2019 01:14 PM   Modules accepted: Orders

## 2019-09-03 ENCOUNTER — Telehealth: Payer: Self-pay | Admitting: *Deleted

## 2019-09-03 NOTE — Telephone Encounter (Signed)
Patient called for results ,results still pending advised to call back. Patient also set up with my chart .

## 2019-09-04 LAB — NOVEL CORONAVIRUS, NAA: SARS-CoV-2, NAA: NOT DETECTED

## 2020-04-01 IMAGING — CR DG CHEST 2V
1 series · 2 of 2 positions shown · non-contrast
Comparison: 05/17/2017

CLINICAL DATA: Chest pain

EXAM:
CHEST - 2 VIEW

[Series 1: dg chest 2 view · 0.14mm/px · 2 of 2 slices shown]
[im 1/2]
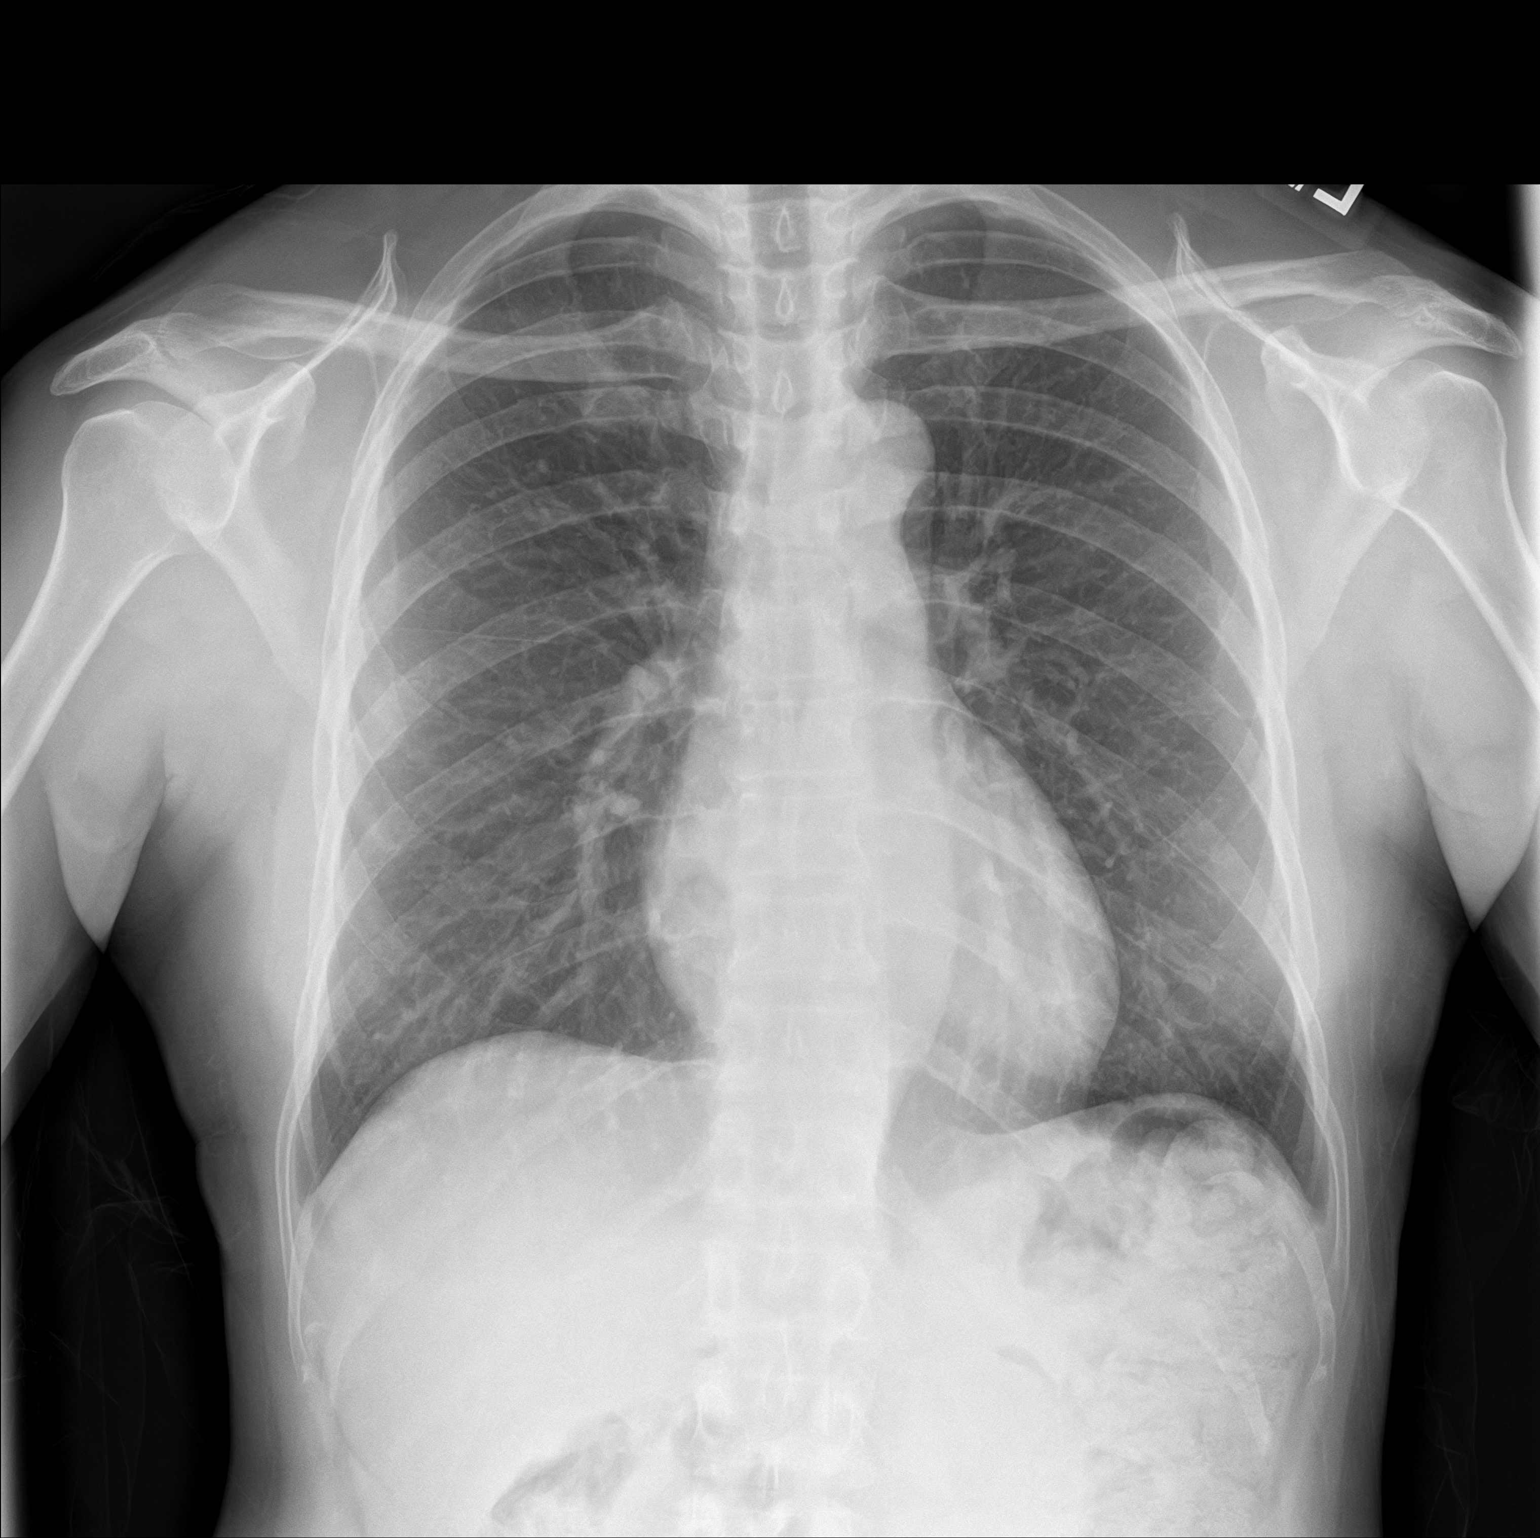
[im 2/2]
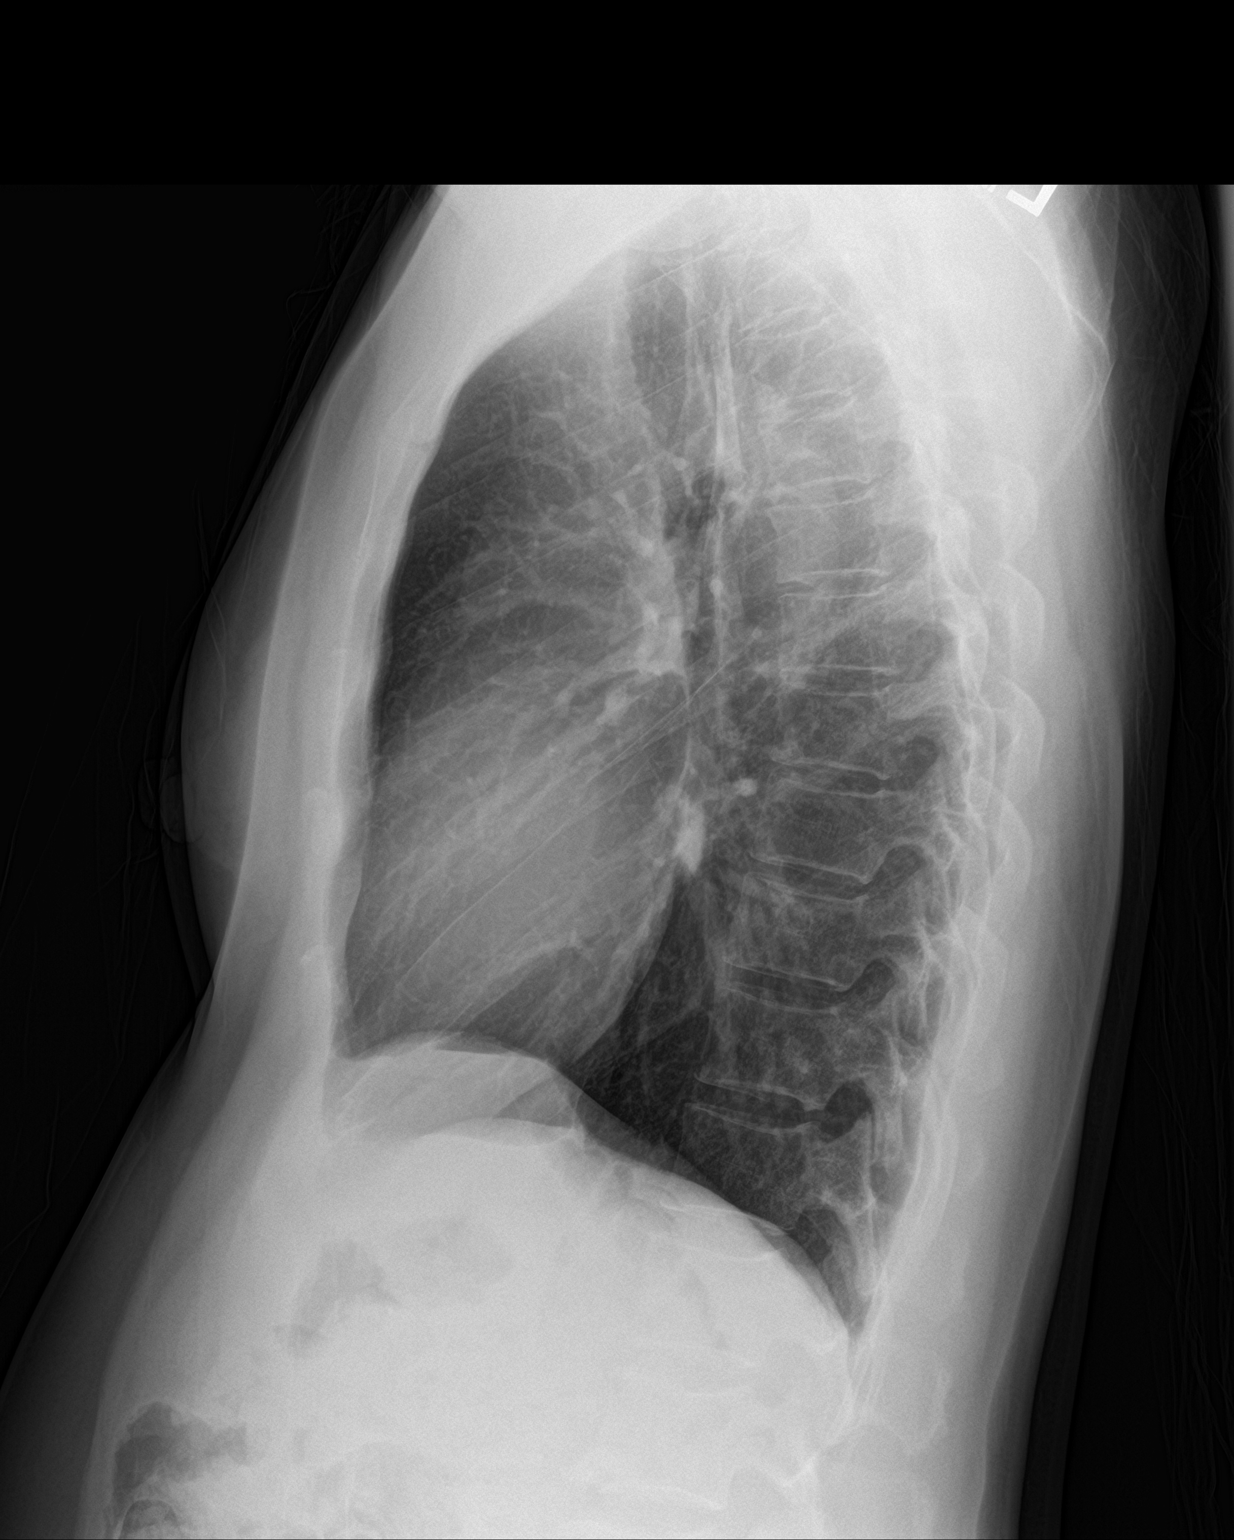

[2 of 2 positions shown; findings below may reference images not displayed]

FINDINGS: Lungs are clear.  No pleural effusion or pneumothorax.

The heart is normal in size.

Visualized osseous structures are within normal limits.
IMPRESSION: Normal chest radiographs.

## 2024-02-29 ENCOUNTER — Ambulatory Visit (INDEPENDENT_AMBULATORY_CARE_PROVIDER_SITE_OTHER): Admitting: Vascular Surgery

## 2024-02-29 ENCOUNTER — Encounter (INDEPENDENT_AMBULATORY_CARE_PROVIDER_SITE_OTHER): Payer: Self-pay | Admitting: Vascular Surgery

## 2024-02-29 VITALS — BP 124/83 | HR 62 | Resp 16 | Ht 67.5 in | Wt 141.6 lb

## 2024-02-29 DIAGNOSIS — I10 Essential (primary) hypertension: Secondary | ICD-10-CM | POA: Diagnosis not present

## 2024-02-29 DIAGNOSIS — I774 Celiac artery compression syndrome: Secondary | ICD-10-CM | POA: Diagnosis not present

## 2024-02-29 DIAGNOSIS — D219 Benign neoplasm of connective and other soft tissue, unspecified: Secondary | ICD-10-CM | POA: Insufficient documentation

## 2024-02-29 NOTE — Assessment & Plan Note (Signed)
 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  She does not however have any significant symptoms that would typically be associated with this compression.  Left lower quadrant pain is very unlikely to be related to this.  This would typically cause postprandial abdominal pain, nausea, and vomiting.  I would plan a follow-up mesenteric duplex next year to monitor this.  If she were develop typical mall symptoms, I told her I do not do that surgery but would get her set up with a surgeon who does at [facility].

## 2024-02-29 NOTE — Progress Notes (Signed)
 Patient ID: Kelli Hernandez, female   DOB: 01-04-1961, 63 y.o.   MRN: [id number]  Chief Complaint  Patient presents with   New Patient (Initial Visit)    Ref Knowles-Jonas consult celiac artery compression syndrome, lower abdominal pain     HPI Kelli Hernandez is a 63 y.o. female.  I am asked to see the patient by Dr. 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  She denies any post-prandial abdominal pain, food fear or nausea and vomiting.  She has had some weight loss.  Her major complaint of pain is a left lower quadrant pain.  This is usually positional particularly if she lays on her left side or has other positions where it seems to be causing pressure.  She did have a uterine myomectomy many years ago.  The CT scan also reported thickening of the endometrium concerning for fibroids.  I was unable to view the images of the scan, only the report as it was not done at our institution.   Past Medical History:  Diagnosis Date   Hypertension     Past Surgical History:  Procedure Laterality Date   MYOMECTOMY       Family History  Problem Relation Age of Onset   Lung cancer Mother    Hypertension Father    Heart failure Father    Diabetes Brother       Social History   Tobacco Use   Smoking status: Never   Smokeless tobacco: Never  Vaping Use   Vaping status: Never Used  Substance Use Topics   Alcohol use: No    Alcohol/week: 0.0 standard drinks of alcohol   Drug use: Never     Allergies  Allergen Reactions   Morphine Other (See Comments)    Other reaction(s): ITCHING Other Reaction: Not Assessed   Sulfa Antibiotics Other (See Comments) and Itching    Other Reaction: Not Assessed    Current Outpatient Medications  Medication Sig Dispense Refill   amLODipine (NORVASC) 5 MG tablet Take 5 mg by mouth.     citalopram (CELEXA) 20 MG tablet Take by mouth.      estradiol (ESTRACE) 0.5 MG tablet Take 0.5 mg by mouth daily.     hydrochlorothiazide (HYDRODIURIL) 12.5 MG tablet Take 12.5 mg by mouth daily. TAKE 1 TABLET (25 MG TOTAL) BY MOUTH DAILY.     meclizine  (ANTIVERT ) 25 MG tablet Take 1 tablet (25 mg total) by mouth 3 (three) times daily as needed. 20 tablet 0   progesterone (PROMETRIUM) 100 MG capsule Take 100 mg by mouth at bedtime.     Acetaminophen 325 MG CAPS Take 3 capsules by mouth daily. (Patient not taking: Reported on 02/29/2024)     aspirin  EC 81 MG tablet Take 81 mg by mouth daily. (Patient not taking: Reported on 02/29/2024)     ibuprofen (ADVIL,MOTRIN) 200 MG tablet Take 200 mg by mouth as needed. (Patient not taking: Reported on 02/29/2024)     NONFORMULARY OR COMPOUNDED ITEM [facility] compound:  (BDV) Tendinosis - Strictures - Scarring cream containing Baclofen 2%, Diclofenac 3%, Verapamil 10% dispense 180 grams +2 refills to apply to affected area 1-2 grams 3-4 times a day. (Patient not taking: Reported on 05/17/2017) 180 each 2   Nutritional Supplements (ESTROVEN NIGHTTIME PO) Take by mouth daily. (Patient not taking: Reported on 02/29/2024)     No current facility-administered medications for this visit.  REVIEW OF SYSTEMS (Negative unless checked)  Constitutional: [] Weight loss  [] Fever  [] Chills Cardiac: [] Chest pain   [] Chest pressure   [] Palpitations   [] Shortness of breath when laying flat   [] Shortness of breath at rest   [] Shortness of breath with exertion. Vascular:  [] Pain in legs with walking   [] Pain in legs at rest   [] Pain in legs when laying flat   [] Claudication   [] Pain in feet when walking  [] Pain in feet at rest  [] Pain in feet when laying flat   [] History of DVT   [] Phlebitis   [] Swelling in legs   [] Varicose veins   [] Non-healing ulcers Pulmonary:   [] Uses home oxygen   [] Productive cough   [] Hemoptysis   [] Wheeze  [] COPD   [] Asthma Neurologic:  [] Dizziness  [] Blackouts   [] Seizures   [] History of stroke    [] History of TIA  [] Aphasia   [] Temporary blindness   [] Dysphagia   [] Weakness or numbness in arms   [] Weakness or numbness in legs Musculoskeletal:  [] Arthritis   [] Joint swelling   [] Joint pain   [] Low back pain Hematologic:  [] Easy bruising  [] Easy bleeding   [] Hypercoagulable state   [] Anemic  [] Hepatitis Gastrointestinal:  [] Blood in stool   [] Vomiting blood  [] Gastroesophageal reflux/heartburn   [x] Abdominal pain Genitourinary:  [] Chronic kidney disease   [] Difficult urination  [] Frequent urination  [] Burning with urination   [] Hematuria Skin:  [] Rashes   [] Ulcers   [] Wounds Psychological:  [] History of anxiety   []  History of major depression.    Physical Exam BP 124/83   Pulse 62   Resp 16   Ht 5' 7.5" (1.715 m)   Wt 141 lb 9.6 oz (64.2 kg)   BMI 21.85 kg/m  Gen:  WD/WN, NAD. Appears far younger than stated age. Head: [place]/AT, No temporalis wasting.  Ear/Nose/Throat: Hearing grossly intact, nares w/o erythema or drainage, oropharynx w/o Erythema/Exudate Eyes: Conjunctiva clear, sclera non-icteric  Neck: trachea midline.  No JVD.  Pulmonary:  Good air movement, respirations not labored, no use of accessory muscles  Cardiac: RRR, no JVD Vascular:  Vessel Right Left  Radial Palpable Palpable                                   Gastrointestinal:. No masses, surgical incisions, or scars. Musculoskeletal: M/S 5/5 throughout.  Extremities without ischemic changes.  No deformity or atrophy. No edema. Neurologic: Sensation grossly intact in extremities.  Symmetrical.  Speech is fluent. Motor exam as listed above. Psychiatric: Judgment intact, Mood & affect appropriate for pt's clinical situation. Dermatologic: No rashes or ulcers noted.  No cellulitis or open wounds.    Radiology No results found.  Labs No results found for this or any previous visit (from the past 2160 hours).  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  She does not however have any significant symptoms that would typically be associated with this compression.  Left lower quadrant pain is very unlikely to be related to this.  This would typically cause postprandial abdominal pain, nausea, and vomiting.  I would plan a follow-up mesenteric duplex next year to monitor this.  If she were develop typical mall symptoms, I told her I do not do that surgery but would get her set up with a surgeon who does at [facility].  Hypertension blood pressure  control important in reducing the progression of atherosclerotic disease. On appropriate oral medications.   Fibroids CT scan also suggested thickening of the endometrium and concern for fibroids.  She has had previous fibroids with myomectomy.  I did discuss that this is actually probably a more likely cause of her left lower quadrant abdominal pain.  If her primary care provider could arrange a pelvic ultrasound I think that would be helpful for further evaluation.  She has previously had a myomectomy.  Possible therapies for this could include uterine artery embolization which I would be happy to perform if desired.  Hysterectomy can also be considered if her symptoms are severe enough.      Mikki Alexander 02/29/2024, 1:04 PM   This note was created with Dragon medical transcription system.  Any errors from dictation are unintentional.

## 2024-02-29 NOTE — Assessment & Plan Note (Signed)
 blood pressure control important in reducing the progression of atherosclerotic disease. On appropriate oral medications.

## 2024-02-29 NOTE — Assessment & Plan Note (Signed)
 CT scan also suggested thickening of the endometrium and concern for fibroids.  She has had previous fibroids with myomectomy.  I did discuss that this is actually probably a more likely cause of her left lower quadrant abdominal pain.  If her primary care provider could arrange a pelvic ultrasound I think that would be helpful for further evaluation.  She has previously had a myomectomy.  Possible therapies for this could include uterine artery embolization which I would be happy to perform if desired.  Hysterectomy can also be considered if her symptoms are severe enough.

## 2025-02-27 ENCOUNTER — Ambulatory Visit (INDEPENDENT_AMBULATORY_CARE_PROVIDER_SITE_OTHER): Admitting: Nurse Practitioner

## 2025-02-27 ENCOUNTER — Encounter (INDEPENDENT_AMBULATORY_CARE_PROVIDER_SITE_OTHER)
